# Patient Record
Sex: Male | Born: 1965
Health system: Southern US, Community
[De-identification: ages and names within clinical notes are randomized; demographics above are authoritative.]

## PROBLEM LIST (undated history)

## (undated) ENCOUNTER — Emergency Department (HOSPITAL_COMMUNITY): Admission: EM | Payer: Self-pay

## (undated) DIAGNOSIS — R030 Elevated blood-pressure reading, without diagnosis of hypertension: Secondary | ICD-10-CM

## (undated) DIAGNOSIS — Z8739 Personal history of other diseases of the musculoskeletal system and connective tissue: Secondary | ICD-10-CM

## (undated) DIAGNOSIS — M109 Gout, unspecified: Secondary | ICD-10-CM

## (undated) DIAGNOSIS — I10 Essential (primary) hypertension: Secondary | ICD-10-CM

## (undated) DIAGNOSIS — M1990 Unspecified osteoarthritis, unspecified site: Secondary | ICD-10-CM

## (undated) DIAGNOSIS — E785 Hyperlipidemia, unspecified: Secondary | ICD-10-CM

## (undated) DIAGNOSIS — L03116 Cellulitis of left lower limb: Secondary | ICD-10-CM

## (undated) HISTORY — DX: Essential (primary) hypertension: I10

## (undated) HISTORY — DX: Gout, unspecified: M10.9

## (undated) HISTORY — PX: KNEE ARTHROSCOPY: SHX127

## (undated) HISTORY — DX: Hyperlipidemia, unspecified: E78.5

## (undated) HISTORY — PX: REPLACEMENT TOTAL KNEE: SUR1224

---
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
## 2008-04-12 ENCOUNTER — Encounter: Admission: RE | Admit: 2008-04-12 | Discharge: 2008-04-12 | Payer: Self-pay | Admitting: Orthopedic Surgery

## 2009-12-06 ENCOUNTER — Encounter: Admission: RE | Admit: 2009-12-06 | Discharge: 2009-12-06 | Payer: Self-pay | Admitting: Neurosurgery

## 2015-11-16 ENCOUNTER — Other Ambulatory Visit: Payer: Self-pay | Admitting: Neurosurgery

## 2015-11-24 MED ORDER — CEFAZOLIN SODIUM-DEXTROSE 2-4 GM/100ML-% IV SOLN
2.0000 g | INTRAVENOUS | Status: AC
  Administered 2015-11-25: 2 g via INTRAVENOUS
  Filled 2015-11-24: qty 100

## 2015-11-25 ENCOUNTER — Encounter (HOSPITAL_COMMUNITY): Payer: Self-pay | Admitting: *Deleted

## 2015-11-25 ENCOUNTER — Ambulatory Visit (HOSPITAL_COMMUNITY)
Admission: RE | Admit: 2015-11-25 | Discharge: 2015-11-25 | Disposition: A | Discharge status: Home / Self Care | Payer: Self-pay | Source: Ambulatory Visit | Attending: Neurosurgery | Admitting: Neurosurgery

## 2015-11-25 ENCOUNTER — Ambulatory Visit (HOSPITAL_COMMUNITY): Payer: Self-pay | Admitting: Anesthesiology

## 2015-11-25 ENCOUNTER — Ambulatory Visit: Admit: 2015-11-25 | Payer: Self-pay | Admitting: Neurosurgery

## 2015-11-25 ENCOUNTER — Encounter (HOSPITAL_COMMUNITY)
Admission: RE | Disposition: A | Discharge status: Home / Self Care | Payer: Self-pay | Source: Ambulatory Visit | Attending: Neurosurgery

## 2015-11-25 DIAGNOSIS — Z87891 Personal history of nicotine dependence: Secondary | ICD-10-CM | POA: Insufficient documentation

## 2015-11-25 DIAGNOSIS — G5603 Carpal tunnel syndrome, bilateral upper limbs: Secondary | ICD-10-CM | POA: Insufficient documentation

## 2015-11-25 DIAGNOSIS — Z6838 Body mass index (BMI) 38.0-38.9, adult: Secondary | ICD-10-CM | POA: Insufficient documentation

## 2015-11-25 HISTORY — PX: CARPAL TUNNEL RELEASE: SHX101

## 2015-11-25 SURGERY — CARPAL TUNNEL RELEASE
Anesthesia: Monitor Anesthesia Care | Laterality: Right

## 2015-11-25 SURGERY — CARPAL TUNNEL RELEASE
Anesthesia: Moderate Sedation | Laterality: Right

## 2015-11-25 MED ORDER — FENTANYL CITRATE (PF) 250 MCG/5ML IJ SOLN
INTRAMUSCULAR | Status: AC
Start: 2015-11-25 — End: 2015-11-25
  Filled 2015-11-25: qty 5

## 2015-11-25 MED ORDER — PROPOFOL 10 MG/ML IV BOLUS
INTRAVENOUS | Status: DC | PRN
  Administered 2015-11-25: 180 mg via INTRAVENOUS

## 2015-11-25 MED ORDER — PROPOFOL 10 MG/ML IV BOLUS
INTRAVENOUS | Status: AC
  Filled 2015-11-25: qty 20

## 2015-11-25 MED ORDER — LIDOCAINE HCL (CARDIAC) 20 MG/ML IV SOLN
INTRAVENOUS | Status: AC
  Filled 2015-11-25: qty 5

## 2015-11-25 MED ORDER — OXYCODONE HCL 5 MG/5ML PO SOLN: 5.0000 mg | Freq: Once | ORAL | Status: DC | PRN

## 2015-11-25 MED ORDER — OXYCODONE HCL 5 MG PO TABS: 5.0000 mg | ORAL_TABLET | Freq: Once | ORAL | Status: DC | PRN

## 2015-11-25 MED ORDER — HYDROCODONE-ACETAMINOPHEN 5-325 MG PO TABS: 1.0000 | ORAL_TABLET | ORAL | Status: DC | PRN

## 2015-11-25 MED ORDER — PHENYLEPHRINE HCL 10 MG/ML IJ SOLN
INTRAMUSCULAR | Status: DC | PRN
Start: 2015-11-25 — End: 2015-11-25
  Administered 2015-11-25: 40 ug via INTRAVENOUS

## 2015-11-25 MED ORDER — ONDANSETRON HCL 4 MG/2ML IJ SOLN
INTRAMUSCULAR | Status: AC
  Filled 2015-11-25: qty 2

## 2015-11-25 MED ORDER — ONDANSETRON HCL 4 MG/2ML IJ SOLN
INTRAMUSCULAR | Status: DC | PRN
  Administered 2015-11-25: 4 mg via INTRAVENOUS

## 2015-11-25 MED ORDER — EPHEDRINE SULFATE 50 MG/ML IJ SOLN
INTRAMUSCULAR | Status: DC | PRN
  Administered 2015-11-25: 10 mg via INTRAVENOUS
  Administered 2015-11-25: 5 mg via INTRAVENOUS
  Administered 2015-11-25: 10 mg via INTRAVENOUS

## 2015-11-25 MED ORDER — LIDOCAINE-EPINEPHRINE 1 %-1:100000 IJ SOLN
INTRAMUSCULAR | Status: DC | PRN
  Administered 2015-11-25: 9 mL

## 2015-11-25 MED ORDER — DEXAMETHASONE SODIUM PHOSPHATE 4 MG/ML IJ SOLN
INTRAMUSCULAR | Status: DC | PRN
  Administered 2015-11-25: 10 mg via INTRAVENOUS

## 2015-11-25 MED ORDER — FENTANYL CITRATE (PF) 100 MCG/2ML IJ SOLN: 25.0000 ug | INTRAMUSCULAR | Status: DC | PRN

## 2015-11-25 MED ORDER — ACETAMINOPHEN 10 MG/ML IV SOLN
INTRAVENOUS | Status: AC
  Administered 2015-11-25: 1000 mg via INTRAVENOUS
  Filled 2015-11-25: qty 100

## 2015-11-25 MED ORDER — LACTATED RINGERS IV SOLN
INTRAVENOUS | Status: DC | PRN
  Administered 2015-11-25: 07:00:00 via INTRAVENOUS

## 2015-11-25 MED ORDER — LIDOCAINE HCL (CARDIAC) 10 MG/ML IV SOLN
INTRAVENOUS | Status: DC | PRN
  Administered 2015-11-25: 80 mg via INTRAVENOUS

## 2015-11-25 MED ORDER — ACETAMINOPHEN 160 MG/5ML PO SOLN: 325.0000 mg | ORAL | Status: DC | PRN

## 2015-11-25 MED ORDER — ACETAMINOPHEN 325 MG PO TABS: 325.0000 mg | ORAL_TABLET | ORAL | Status: DC | PRN

## 2015-11-25 MED ORDER — MIDAZOLAM HCL 2 MG/2ML IJ SOLN
INTRAMUSCULAR | Status: AC
  Filled 2015-11-25: qty 2

## 2015-11-25 MED ORDER — MIDAZOLAM HCL 5 MG/5ML IJ SOLN
INTRAMUSCULAR | Status: DC | PRN
  Administered 2015-11-25: 2 mg via INTRAVENOUS

## 2015-11-25 MED ORDER — BUPIVACAINE HCL (PF) 0.5 % IJ SOLN
INTRAMUSCULAR | Status: DC | PRN
  Administered 2015-11-25: 9 mL

## 2015-11-25 MED ORDER — FENTANYL CITRATE (PF) 100 MCG/2ML IJ SOLN
INTRAMUSCULAR | Status: DC | PRN
  Administered 2015-11-25: 50 ug via INTRAVENOUS
  Administered 2015-11-25 (×2): 100 ug via INTRAVENOUS

## 2015-11-25 SURGICAL SUPPLY — 32 items
BANDAGE GAUZE 4  KLING STR (GAUZE/BANDAGES/DRESSINGS) ×2 IMPLANT
BLADE SURG 15 STRL LF DISP TIS (BLADE) ×2 IMPLANT
BLADE SURG 15 STRL SS (BLADE) ×4
BRUSH SCRUB EZ PLAIN DRY (MISCELLANEOUS) ×4 IMPLANT
CORDS BIPOLAR (ELECTRODE) ×2 IMPLANT
DRAPE EXTREMITY T 121X128X90 (DRAPE) ×2 IMPLANT
DRAPE PROXIMA HALF (DRAPES) IMPLANT
DRSG EMULSION OIL 3X3 NADH (GAUZE/BANDAGES/DRESSINGS) ×2 IMPLANT
GAUZE SPONGE 4X4 12PLY STRL (GAUZE/BANDAGES/DRESSINGS) ×2 IMPLANT
GAUZE SPONGE 4X4 16PLY XRAY LF (GAUZE/BANDAGES/DRESSINGS) ×2 IMPLANT
GLOVE BIOGEL PI IND STRL 8 (GLOVE) ×1 IMPLANT
GLOVE BIOGEL PI INDICATOR 8 (GLOVE) ×1
GLOVE ECLIPSE 7.5 STRL STRAW (GLOVE) ×2 IMPLANT
GLOVE EXAM NITRILE LRG STRL (GLOVE) IMPLANT
GLOVE EXAM NITRILE MD LF STRL (GLOVE) IMPLANT
GLOVE EXAM NITRILE XL STR (GLOVE) IMPLANT
GLOVE EXAM NITRILE XS STR PU (GLOVE) IMPLANT
GLOVE INDICATOR 7.0 STRL GRN (GLOVE) ×2 IMPLANT
GLOVE INDICATOR 7.5 STRL GRN (GLOVE) ×4 IMPLANT
GLOVE SS N UNI LF 6.5 STRL (GLOVE) ×4 IMPLANT
GOWN STRL REUS W/ TWL XL LVL3 (GOWN DISPOSABLE) ×2 IMPLANT
GOWN STRL REUS W/TWL 2XL LVL3 (GOWN DISPOSABLE) IMPLANT
GOWN STRL REUS W/TWL XL LVL3 (GOWN DISPOSABLE) ×4
KIT BASIN OR (CUSTOM PROCEDURE TRAY) ×2 IMPLANT
KIT ROOM TURNOVER OR (KITS) ×2 IMPLANT
PACK SURGICAL SETUP 50X90 (CUSTOM PROCEDURE TRAY) ×2 IMPLANT
PAD ARMBOARD 7.5X6 YLW CONV (MISCELLANEOUS) ×6 IMPLANT
STOCKINETTE 4X48 STRL (DRAPES) ×2 IMPLANT
SUT ETHILON 4 0 PS 2 18 (SUTURE) ×2 IMPLANT
SUT VIC AB 3-0 SH 8-18 (SUTURE) ×2 IMPLANT
TOWEL OR 17X24 6PK STRL BLUE (TOWEL DISPOSABLE) ×6 IMPLANT
UNDERPAD 30X30 INCONTINENT (UNDERPADS AND DIAPERS) ×2 IMPLANT

## 2015-11-25 NOTE — Transfer of Care (Signed)
Immediate Anesthesia Transfer of Care Note  Patient: Christopher Fitzpatrick E Oldenkamp  Procedure(s) Performed: Procedure(s): CARPAL TUNNEL RELEASE (Right)  Patient Location: PACU  Anesthesia Type:General  Level of Consciousness: awake, alert , oriented and patient cooperative  Airway & Oxygen Therapy: Patient Spontanous Breathing  Post-op Assessment: Report given to RN, Post -op Vital signs reviewed and stable and Patient moving all extremities  Post vital signs: Reviewed and stable  Last Vitals:  Filed Vitals:   11/25/15 0915 11/25/15 0930  BP: 133/75 129/74  Pulse: 77 77  Temp: 36.3 C   Resp: 8 13    Complications: No apparent anesthesia complications

## 2015-11-25 NOTE — Progress Notes (Signed)
Christopher Fitzpatrick to be D/C'd home per MD order. Discussed with the patient and all questions fully answered.  VSS, Surgical site clean, dry, intact with dressing in place.  IV catheter discontinued intact. Site without signs and symptoms of complications. Dressing and pressure applied.  An After Visit Summary was printed and given to the patient. Patient received prescription.  D/c education completed with patient/family including follow up instructions, medication list, d/c activities limitations if indicated, with other d/c instructions as indicated by MD - patient able to verbalize understanding, all questions fully answered.   Patient instructed to return to ED, call 911, or call MD for any changes in condition.   Patient to be escorted via WC, and D/C home via private auto.

## 2015-11-25 NOTE — Anesthesia Postprocedure Evaluation (Signed)
Anesthesia Post Note  Patient: Christopher Fitzpatrick  Procedure(s) Performed: Procedure(s) (LRB): CARPAL TUNNEL RELEASE (Right)  Patient location during evaluation: PACU Anesthesia Type: General Level of consciousness: awake and alert Pain management: pain level controlled Vital Signs Assessment: post-procedure vital signs reviewed and stable Respiratory status: spontaneous breathing, nonlabored ventilation and respiratory function stable Cardiovascular status: blood pressure returned to baseline and stable Postop Assessment: no signs of nausea or vomiting Anesthetic complications: no    Last Vitals:  Filed Vitals:   11/25/15 1000 11/25/15 1056  BP: 121/78 128/88  Pulse: 76 64  Temp:  36.3 C  Resp: 12 13    Last Pain: There were no vitals filed for this visit.               Shailene Demonbreun A

## 2015-11-25 NOTE — Anesthesia Procedure Notes (Signed)
Procedure Name: LMA Insertion Date/Time: 11/25/2015 7:58 AM Performed by: Orlinda BlalockMCMILLEN, Myia Bergh L Pre-anesthesia Checklist: Patient identified, Emergency Drugs available, Suction available and Patient being monitored Patient Re-evaluated:Patient Re-evaluated prior to inductionOxygen Delivery Method: Circle System Utilized Preoxygenation: Pre-oxygenation with 100% oxygen Intubation Type: IV induction Ventilation: Mask ventilation without difficulty LMA: LMA inserted LMA Size: 5.0 Number of attempts: 1 Placement Confirmation: positive ETCO2 and breath sounds checked- equal and bilateral Tube secured with: Tape Dental Injury: Teeth and Oropharynx as per pre-operative assessment

## 2015-11-25 NOTE — Progress Notes (Signed)
Filed Vitals:   11/25/15 0651 11/25/15 0652 11/25/15 0915  BP:  137/90 133/75  Pulse: 58  77  Temp: 97.9 F (36.6 C)  97.3 F (36.3 C)  TempSrc: Oral    Resp: 20  8  Height: 5\' 10"  (1.778 m)    Weight: 120.203 kg (265 lb)    SpO2: 97%  95%    Patient resting in the PACU, steadily becoming more alert. Following commands. Moving digits of right hand well. Good capillary refill in right hand digits.  Plan: To be discharged home once fully awake alert, and cleared by anesthesia service for discharge. Scheduled follow-up with me in the office in 4 days.  Hewitt ShortsNUDELMAN,ROBERT W, MD 11/25/2015, 9:25 AM

## 2015-11-25 NOTE — Anesthesia Preprocedure Evaluation (Signed)
Anesthesia Evaluation  Patient identified by MRN, date of birth, ID band Patient awake    Reviewed: Allergy & Precautions, NPO status , Patient's Chart, lab work & pertinent test results  History of Anesthesia Complications Negative for: history of anesthetic complications  Airway Mallampati: III  TM Distance: >3 FB Neck ROM: Full    Dental  (+) Teeth Intact   Pulmonary neg pulmonary ROS,    breath sounds clear to auscultation       Cardiovascular negative cardio ROS   Rhythm:Regular     Neuro/Psych negative neurological ROS  negative psych ROS   GI/Hepatic Neg liver ROS,   Endo/Other  Morbid obesity  Renal/GU negative Renal ROS     Musculoskeletal   Abdominal   Peds  Hematology negative hematology ROS (+)   Anesthesia Other Findings   Reproductive/Obstetrics                             Anesthesia Physical Anesthesia Plan  ASA: II  Anesthesia Plan: MAC   Post-op Pain Management:    Induction: Intravenous  Airway Management Planned: Nasal Cannula, Natural Airway and Simple Face Mask  Additional Equipment: None  Intra-op Plan:   Post-operative Plan:   Informed Consent: I have reviewed the patients History and Physical, chart, labs and discussed the procedure including the risks, benefits and alternatives for the proposed anesthesia with the patient or authorized representative who has indicated his/her understanding and acceptance.   Dental advisory given  Plan Discussed with: CRNA and Surgeon  Anesthesia Plan Comments:         Anesthesia Quick Evaluation

## 2015-11-25 NOTE — Discharge Instructions (Signed)
-  Elevate arms and hands on pillows while sitting or in bed. Wear sling when up moving around. -Use prescribed Norco or Tylenol for pain control. -Follow Up for bandage removal Tuesday 4/11 with Dr. Jule SerNudleman. -Perform hand exercises hourly.

## 2015-11-25 NOTE — Progress Notes (Signed)
Orthopedic Tech Progress Note Patient Details:  Christopher HallLee E Delapena 06-Jun-1966 [id number]  Ortho Devices Type of Ortho Device: Arm sling Ortho Device/Splint Location: rue Ortho Device/Splint Interventions: Application Viewed order from doctor's order list  Nikki DomCrawford, Yaneisy Wenz 11/25/2015, 9:26 AM

## 2015-11-25 NOTE — H&P (Signed)
Subjective: Patient is a 50 y.o. male who is admitted for treatment of bilateral carpal tunnel syndrome. Patient has been having difficulties with numbness and tingling in his hands bilaterally, worse on the right than the left, for the past 9 months. Using a mouse, he finds that he has to tend to shake his right hand to relieve the numbness and tingling. More recently he is found that the numbness and tingling can awaken him at night. About a month ago he was doing some landscaping, including some shoveling, and since then the numbness and tingling has become persistent to the first through fourth digits of the hands bilaterally. He reports good sensation in the fifth digits. He is found to that he developed a tendency to drop things from his hands, he feels most likely due to not feeling the objects.  We evaluated him with bilateral upper extremity nerve conductions which revealed severe carpal tunnel syndrome with motor demyelination and sensory axonal loss. Examination showed weakness of the right opponens pollicis. Patient is brought to surgery for a right carpal tunnel release, with plans to proceed with left carpal tunnel release once he is recovered from the initial surgery.  Past medical history: Patient has no history of hypertension, myocardial infarction, cancer, stroke, diabetes, peptic ulcer disease, or lung disease. Previous surgery includes multiple knee surgeries as well as bilateral herniorrhaphies. He denies allergies to medications. And currently takes no medications.  Family history:  Parents have passed on. Mother died at 171, she had cancer, hypertension, diabetes, and kidney disease. Father died at age 50, he had hypertension and diabetes.  social history: Patient is married. He is self-employed. He quit smoking 3 months ago. he smoked for 27 years before that. He drinks alcohol beverages socially. He denies history of substance abuse.  Review of Systems A comprehensive review of  systems was negative.   EXAM: Patient well-developed well-nourished white male in no acute distress.  Lungs are clear to auscultation , the patient has symmetrical respiratory excursion. Heart has a regular rate and rhythm normal S1 and S2 no murmur.   Abdomen is soft nontender nondistended bowel sounds are present. Extremity examination shows no clubbing cyanosis or edema. Motor examination shows 5 over 5 strength in the upper extremities except for the right opponens pollicis which is 4+/5. We find the deltoid biceps triceps  and grip are 5/5, as are the left intrinsics. Sensation decreased to pinprick in the first through fourth digits of the hands bilaterally with intact sensation in the fifth digits bilaterally.  Reflexes are absent and symmetrical, and without evidence of pathologic reflexes. Patient has a normal gait and stance.   Assessment/Plan: Patient with severe bilateral carpal tunnel syndrome, who is admitted for right carpal tunnel release, with a plan to proceed with left carpal tunnel release once he is recovered sufficiently from the initial surgery. I discussed the nature of his condition, the nature the surgical procedure, the uncertainty of clinical recovery, typical length of surgery and recovery, and risks of surgery including risks of infection, bleeding, neurologic dysfunction, and anesthetic complications of myocardial infarction, stroke, pneumonia, and death.   Hewitt ShortsNUDELMAN,ROBERT W, MD 11/25/2015 6:51 AM

## 2015-11-25 NOTE — Op Note (Signed)
11/25/2015  9:07 AM  PATIENT:  Christopher Fitzpatrick  50 y.o. male  PRE-OPERATIVE DIAGNOSIS:  Bilateral Carpal Tunnel Syndrome  POST-OPERATIVE DIAGNOSIS:  Bilateral Carpal Tunnel Syndrome  PROCEDURE:  Procedure(s):  Right CARPAL TUNNEL RELEASE  SURGEON:  Surgeon(s): Shirlean Kellyobert Nudelman, MD  ANESTHESIA:   general with LMA  EBL:  Total I/O In: -  Out: 0.5 [Blood:0.5]  BLOOD ADMINISTERED:none  COUNT:  Correct per nursing staff  DICTATION: Patient was brought to the operating room and placed under general anesthesia. The right upper extremity was prepped with Betadine soap and solution and draped in a sterile fashion. An incision was made in the right palm extending from just distal to the distal carpal crease towards the mid palm. It is located just medial to the thenar crease. Dissection was carried down through the subcutaneous tissue to the transverse carpal ligament. The ligament was opened sharply from a distal to proximal direction taking care to leave the underlying structures undisturbed. It was opened in its full distal to proximal extent. Good decompression of the carpal tunnel and its contents was achieved. We then proceeded with closure. Subcutaneous layer was closed with interrupted inverted 3-0 undyed Vicryl suture. Skin is approximate interrupted horizontal mattress sutures using 4-0 nylon. The wound was dressed with adaptic, gauze fluffs, and wrapped with a Kling. The procedure was tolerated well, and following the procedure the patient was transferred to the recovery room for further care.  PLAN OF CARE: Discharge to home after PACU  PATIENT DISPOSITION:  PACU - hemodynamically stable.

## 2015-11-25 NOTE — Progress Notes (Signed)
No labs needed per Dr. Moser 

## 2015-11-29 ENCOUNTER — Encounter (HOSPITAL_COMMUNITY): Payer: Self-pay | Admitting: Neurosurgery

## 2015-12-07 ENCOUNTER — Other Ambulatory Visit: Payer: Self-pay | Admitting: Neurosurgery

## 2015-12-21 ENCOUNTER — Encounter (HOSPITAL_COMMUNITY): Payer: Self-pay | Admitting: *Deleted

## 2015-12-21 NOTE — Progress Notes (Signed)
Pt states nothing has changed with his medications, allergies, medical and surgical history since he was seen here on 11/25/15 for surgery. Denies any recent chest pain or sob.

## 2015-12-22 MED ORDER — DEXTROSE 5 % IV SOLN: 3.0000 g | INTRAVENOUS | Status: DC

## 2015-12-22 MED ORDER — DEXTROSE 5 % IV SOLN
3000.0000 mg | INTRAVENOUS | Status: AC
  Administered 2015-12-23: 3 g via INTRAVENOUS
  Filled 2015-12-22: qty 3000

## 2015-12-23 ENCOUNTER — Encounter (HOSPITAL_COMMUNITY): Payer: Self-pay | Admitting: *Deleted

## 2015-12-23 ENCOUNTER — Ambulatory Visit (HOSPITAL_COMMUNITY): Payer: MEDICAID | Admitting: Certified Registered Nurse Anesthetist

## 2015-12-23 ENCOUNTER — Encounter (HOSPITAL_COMMUNITY)
Admission: RE | Disposition: A | Discharge status: Home / Self Care | Payer: Self-pay | Source: Ambulatory Visit | Attending: Neurosurgery

## 2015-12-23 ENCOUNTER — Ambulatory Visit (HOSPITAL_COMMUNITY)
Admission: RE | Admit: 2015-12-23 | Discharge: 2015-12-23 | Disposition: A | Discharge status: Home / Self Care | Payer: Self-pay | Source: Ambulatory Visit | Attending: Neurosurgery | Admitting: Neurosurgery

## 2015-12-23 DIAGNOSIS — Z87891 Personal history of nicotine dependence: Secondary | ICD-10-CM | POA: Insufficient documentation

## 2015-12-23 DIAGNOSIS — G5603 Carpal tunnel syndrome, bilateral upper limbs: Secondary | ICD-10-CM | POA: Insufficient documentation

## 2015-12-23 HISTORY — PX: CARPAL TUNNEL RELEASE: SHX101

## 2015-12-23 SURGERY — CARPAL TUNNEL RELEASE
Anesthesia: General | Site: Arm Lower | Laterality: Left

## 2015-12-23 MED ORDER — ACETAMINOPHEN 10 MG/ML IV SOLN
INTRAVENOUS | Status: DC | PRN
  Administered 2015-12-23: 1000 mg via INTRAVENOUS

## 2015-12-23 MED ORDER — LACTATED RINGERS IV SOLN
INTRAVENOUS | Status: DC | PRN
  Administered 2015-12-23: 07:00:00 via INTRAVENOUS

## 2015-12-23 MED ORDER — FENTANYL CITRATE (PF) 100 MCG/2ML IJ SOLN
INTRAMUSCULAR | Status: DC | PRN
  Administered 2015-12-23: 100 ug via INTRAVENOUS
  Administered 2015-12-23: 50 ug via INTRAVENOUS
  Administered 2015-12-23 (×2): 25 ug via INTRAVENOUS

## 2015-12-23 MED ORDER — PROPOFOL 10 MG/ML IV BOLUS
INTRAVENOUS | Status: AC
  Filled 2015-12-23: qty 40

## 2015-12-23 MED ORDER — FENTANYL CITRATE (PF) 250 MCG/5ML IJ SOLN
INTRAMUSCULAR | Status: AC
  Filled 2015-12-23: qty 5

## 2015-12-23 MED ORDER — LIDOCAINE HCL (CARDIAC) 20 MG/ML IV SOLN
INTRAVENOUS | Status: DC | PRN
  Administered 2015-12-23: 50 mg via INTRAVENOUS

## 2015-12-23 MED ORDER — PROPOFOL 10 MG/ML IV BOLUS
INTRAVENOUS | Status: DC | PRN
  Administered 2015-12-23: 200 mg via INTRAVENOUS

## 2015-12-23 MED ORDER — BUPIVACAINE HCL 0.5 % IJ SOLN
INTRAMUSCULAR | Status: DC | PRN
  Administered 2015-12-23: 8 mL

## 2015-12-23 MED ORDER — MIDAZOLAM HCL 5 MG/5ML IJ SOLN
INTRAMUSCULAR | Status: DC | PRN
  Administered 2015-12-23 (×2): 1 mg via INTRAVENOUS

## 2015-12-23 MED ORDER — EPHEDRINE SULFATE 50 MG/ML IJ SOLN
INTRAMUSCULAR | Status: DC | PRN
  Administered 2015-12-23: 10 mg via INTRAVENOUS
  Administered 2015-12-23: 5 mg via INTRAVENOUS

## 2015-12-23 MED ORDER — ONDANSETRON HCL 4 MG/2ML IJ SOLN
INTRAMUSCULAR | Status: DC | PRN
  Administered 2015-12-23: 4 mg via INTRAVENOUS

## 2015-12-23 MED ORDER — MIDAZOLAM HCL 2 MG/2ML IJ SOLN
INTRAMUSCULAR | Status: AC
  Filled 2015-12-23: qty 2

## 2015-12-23 SURGICAL SUPPLY — 29 items
BANDAGE GAUZE 4  KLING STR (GAUZE/BANDAGES/DRESSINGS) ×2 IMPLANT
BLADE SURG 15 STRL LF DISP TIS (BLADE) ×2 IMPLANT
BLADE SURG 15 STRL SS (BLADE) ×4
BRUSH SCRUB EZ PLAIN DRY (MISCELLANEOUS) ×2 IMPLANT
CORDS BIPOLAR (ELECTRODE) ×2 IMPLANT
DRAPE EXTREMITY T 121X128X90 (DRAPE) ×2 IMPLANT
DRAPE PROXIMA HALF (DRAPES) IMPLANT
DRSG EMULSION OIL 3X3 NADH (GAUZE/BANDAGES/DRESSINGS) ×2 IMPLANT
GAUZE SPONGE 4X4 12PLY STRL (GAUZE/BANDAGES/DRESSINGS) ×2 IMPLANT
GAUZE SPONGE 4X4 16PLY XRAY LF (GAUZE/BANDAGES/DRESSINGS) ×2 IMPLANT
GLOVE BIOGEL PI IND STRL 8 (GLOVE) ×1 IMPLANT
GLOVE BIOGEL PI INDICATOR 8 (GLOVE) ×1
GLOVE ECLIPSE 7.5 STRL STRAW (GLOVE) ×2 IMPLANT
GLOVE EXAM NITRILE LRG STRL (GLOVE) IMPLANT
GLOVE EXAM NITRILE MD LF STRL (GLOVE) IMPLANT
GLOVE EXAM NITRILE XL STR (GLOVE) IMPLANT
GLOVE EXAM NITRILE XS STR PU (GLOVE) IMPLANT
GOWN STRL REUS W/ TWL XL LVL3 (GOWN DISPOSABLE) ×2 IMPLANT
GOWN STRL REUS W/TWL 2XL LVL3 (GOWN DISPOSABLE) IMPLANT
GOWN STRL REUS W/TWL XL LVL3 (GOWN DISPOSABLE) ×4
KIT BASIN OR (CUSTOM PROCEDURE TRAY) ×2 IMPLANT
KIT ROOM TURNOVER OR (KITS) ×2 IMPLANT
PACK SURGICAL SETUP 50X90 (CUSTOM PROCEDURE TRAY) ×2 IMPLANT
PAD ARMBOARD 7.5X6 YLW CONV (MISCELLANEOUS) ×6 IMPLANT
STOCKINETTE 4X48 STRL (DRAPES) ×2 IMPLANT
SUT ETHILON 4 0 PS 2 18 (SUTURE) ×2 IMPLANT
SUT VIC AB 3-0 SH 8-18 (SUTURE) ×2 IMPLANT
TOWEL OR 17X24 6PK STRL BLUE (TOWEL DISPOSABLE) ×6 IMPLANT
UNDERPAD 30X30 INCONTINENT (UNDERPADS AND DIAPERS) ×2 IMPLANT

## 2015-12-23 NOTE — Op Note (Signed)
12/23/2015  8:21 AM  PATIENT:  Christopher Fitzpatrick  50 y.o. male  PRE-OPERATIVE DIAGNOSIS:  Bilateral carpal tunnel syndrome  POST-OPERATIVE DIAGNOSIS:  Bilateral carpal tunnel syndrome  PROCEDURE:  Procedure(s):  Left CARPAL TUNNEL RELEASE  SURGEON:  Surgeon(s): Shirlean Kellyobert Nudelman, MD  ANESTHESIA:   general  EBL:  Total I/O In: -  Out: 10 [Blood:10]  BLOOD ADMINISTERED:none  COUNT: Correct per nursing staff  DICTATION: Patient was brought to the operating room and placed under general anesthesia, using an LMA by the anesthesia service. The left upper extremity was prepped with Betadine soap and solution and draped in a sterile fashion. An incision was made in the left palm extending from just distal to the distal carpal crease towards the mid palm. It is located just medial to the thenar crease. Dissection was carried down to subcutaneous tissue to the transverse carpal ligament. The ligament was opened sharply from a distal to proximal direction taking care to leave the underlying structures undisturbed. It was opened in its full distal to proximal extent. Good decompression of the carpal tunnel and its contents was achieved. Bipolar cautery was used to maintain hemostasis. Once the decompression was completed and hemostasis established, we injected 9 mL of O.5 percent Marcaine into the subcutaneous tissues. We then proceeded with closure. Subcutaneous layer was closed with interrupted inverted 3-0 undyed Vicryl suture. Skin is approximate interrupted horizontal mattress sutures using 4-0 nylon. The wound was dressed with gauze fluffs and wrapped with a Kling. The procedure was tolerated well, and following the procedure the patient wishes to the recovery room for further care.   PLAN OF CARE: Discharge to home after PACU  PATIENT DISPOSITION:  PACU - hemodynamically stable.   Delay start of Pharmacological VTE agent (>24hrs) due to surgical blood loss or risk of bleeding:  yes

## 2015-12-23 NOTE — Anesthesia Procedure Notes (Signed)
Procedure Name: LMA Insertion Date/Time: 12/23/2015 7:33 AM Performed by: Bobbie StackANDERSON, Naiomi Musto KIRSTEN Pre-anesthesia Checklist: Patient identified, Timeout performed, Emergency Drugs available, Suction available and Patient being monitored Patient Re-evaluated:Patient Re-evaluated prior to inductionOxygen Delivery Method: Circle system utilized Preoxygenation: Pre-oxygenation with 100% oxygen Intubation Type: IV induction Ventilation: Two handed mask ventilation required, Oral airway inserted - appropriate to patient size and Mask ventilation without difficulty LMA: LMA inserted LMA Size: 5.0 Number of attempts: 1 Tube secured with: Tape Dental Injury: Teeth and Oropharynx as per pre-operative assessment

## 2015-12-23 NOTE — H&P (Signed)
Subjective: Patient is a 50 y.o. left-handed white male who is admitted for treatment of bilateral carpal tunnel syndrome.  Patient presented with bilateral numbness and tingling in his hands, and weakness of the right opponens pollicis, and was found electrophysiologically to have bilateral carpal tunnel syndrome right worse than left. He is 4 weeks status post right carpal tunnel release, which is healed well, and returns now for left carpal tunnel release.   Past Medical History  Diagnosis Date  . Medical history non-contributory     Past Surgical History  Procedure Laterality Date  . Knee reconstruction Right 1985  . Hernia repair  1986  . Knee arthroplasty Right 1987  . Knee arthroscopy Right [id number] left knee  . Carpal tunnel release Right 11/25/2015    Procedure: CARPAL TUNNEL RELEASE;  Surgeon: Shirlean Kellyobert Nudelman, MD;  Location: [facility];  Service: Neurosurgery;  Laterality: Right;    Prescriptions prior to admission  Medication Sig Dispense Refill Last Dose  . HYDROcodone-acetaminophen (NORCO) 5-325 MG tablet Take 1-2 tablets by mouth every 4 (four) hours as needed (pain). 50 tablet 0 Past Month at Unknown time   No Known Allergies  Social History  Substance Use Topics  . Smoking status: Former Smoker -- 1.50 packs/day for 25 years    Types: Cigarettes, Cigars    Quit date: 07/27/2015  . Smokeless tobacco: Not on file  . Alcohol Use: No    History reviewed. No pertinent family history.   Review of Systems A comprehensive review of systems was negative.  Objective: Vital signs in last 24 hours: Temp:  [97.7 F (36.5 C)] 97.7 F (36.5 C) (05/05 0614) Pulse Rate:  [62] 62 (05/05 0614) Resp:  [20] 20 (05/05 0614) BP: (143)/(90) 143/90 mmHg (05/05 0614) SpO2:  [96 %] 96 % (05/05 0614) Weight:  [120.203 kg (265 lb)] 120.203 kg (265 lb) (05/05 [id number])  EXAM: Patient well-developed well-nourished white male in no acute distress. Lungs are clear to auscultation , the  patient has symmetrical respiratory excursion. Heart has a regular rate and rhythm normal S1 and S2 no murmur. Abdomen is soft nontender nondistended bowel sounds are present. Extremity examination shows no clubbing cyanosis or edema; the right hand palm incision is nicely healed. Motor examination shows 5 over 5 strength in the upper extremities except for the right opponens pollicis which is 4+/5. We find the deltoid biceps triceps and grip are 5/5, as are the left intrinsics. Sensation decreased to pinprick in the first through fourth digits of the hands bilaterally with intact sensation in the fifth digits bilaterally. Reflexes are absent and symmetrical, and without evidence of pathologic reflexes. Patient has a normal gait and stance.  Assessment/Plan: Patient who presented with severe bilateral carpal tunnel syndrome, who is now status post a right carpal tunnel release 4 weeks ago, and returns now for a left carpal tunnel release.  I discussed the nature of his condition, the nature the surgical procedure, the uncertainty of clinical recovery, typical length of surgery and recovery, and risks of surgery including risks of infection, bleeding, neurologic dysfunction, and anesthetic complications of myocardial infarction, stroke, pneumonia, and death.    Hewitt ShortsNUDELMAN,ROBERT W, MD 12/23/2015 7:12 AM

## 2015-12-23 NOTE — Transfer of Care (Signed)
Immediate Anesthesia Transfer of Care Note  Patient: Christopher Fitzpatrick  Procedure(s) Performed: Procedure(s) with comments: CARPAL TUNNEL RELEASE (Left) - Left Carpal Tunnel Release  Patient Location: PACU  Anesthesia Type:General  Level of Consciousness: awake, alert , oriented and patient cooperative  Airway & Oxygen Therapy: Patient Spontanous Breathing and Patient connected to nasal cannula oxygen  Post-op Assessment: Report given to RN, Post -op Vital signs reviewed and stable and Patient moving all extremities X 4  Post vital signs: Reviewed and stable  Last Vitals:  Filed Vitals:   12/23/15 0830 12/23/15 0832  BP:  139/93  Pulse:  75  Temp: 36.5 C   Resp:  12    Last Pain: There were no vitals filed for this visit.    Patients Stated Pain Goal: 3 (12/23/15 [id number])  Complications: No apparent anesthesia complications

## 2015-12-23 NOTE — Anesthesia Preprocedure Evaluation (Signed)
Anesthesia Evaluation  Patient identified by MRN, date of birth, ID band Patient awake    Reviewed: Allergy & Precautions, NPO status , Patient's Chart, lab work & pertinent test results  Airway Mallampati: II  TM Distance: >3 FB Neck ROM: Full    Dental  (+) Teeth Intact, Dental Advisory Given   Pulmonary former smoker,    breath sounds clear to auscultation       Cardiovascular  Rhythm:Regular Rate:Normal     Neuro/Psych    GI/Hepatic   Endo/Other    Renal/GU      Musculoskeletal   Abdominal   Peds  Hematology   Anesthesia Other Findings   Reproductive/Obstetrics                             Anesthesia Physical Anesthesia Plan  ASA: II  Anesthesia Plan: General   Post-op Pain Management:    Induction: Intravenous  Airway Management Planned: LMA  Additional Equipment:   Intra-op Plan:   Post-operative Plan:   Informed Consent: I have reviewed the patients History and Physical, chart, labs and discussed the procedure including the risks, benefits and alternatives for the proposed anesthesia with the patient or authorized representative who has indicated his/her understanding and acceptance.   Dental advisory given  Plan Discussed with: CRNA and Anesthesiologist  Anesthesia Plan Comments:         Anesthesia Quick Evaluation  

## 2015-12-23 NOTE — Anesthesia Postprocedure Evaluation (Signed)
Anesthesia Post Note  Patient: Doloris HallLee E Cattell  Procedure(s) Performed: Procedure(s) (LRB): CARPAL TUNNEL RELEASE (Left)  Patient location during evaluation: PACU Anesthesia Type: General Level of consciousness: awake, awake and alert and oriented Pain management: pain level controlled Vital Signs Assessment: post-procedure vital signs reviewed and stable Respiratory status: spontaneous breathing, nonlabored ventilation and respiratory function stable Cardiovascular status: blood pressure returned to baseline Anesthetic complications: no    Last Vitals:  Filed Vitals:   12/23/15 0902 12/23/15 0913  BP: 138/83   Pulse: 58   Temp:  36.2 C  Resp: 11     Last Pain: There were no vitals filed for this visit.               Arav Bannister COKER

## 2015-12-26 ENCOUNTER — Encounter (HOSPITAL_COMMUNITY): Payer: Self-pay | Admitting: Neurosurgery

## 2017-12-02 ENCOUNTER — Other Ambulatory Visit: Payer: Self-pay

## 2017-12-02 ENCOUNTER — Inpatient Hospital Stay (HOSPITAL_COMMUNITY)
Admission: EM | Admit: 2017-12-02 | Discharge: 2017-12-04 | DRG: 603 | Disposition: A | Discharge status: Home / Self Care | Payer: BLUE CROSS/BLUE SHIELD | Attending: Internal Medicine | Admitting: Internal Medicine

## 2017-12-02 ENCOUNTER — Emergency Department (HOSPITAL_BASED_OUTPATIENT_CLINIC_OR_DEPARTMENT_OTHER)
Admit: 2017-12-02 | Discharge: 2017-12-02 | Disposition: A | Discharge status: Home / Self Care | Payer: BLUE CROSS/BLUE SHIELD | Attending: Emergency Medicine | Admitting: Emergency Medicine

## 2017-12-02 ENCOUNTER — Encounter (HOSPITAL_COMMUNITY): Payer: Self-pay | Admitting: Emergency Medicine

## 2017-12-02 DIAGNOSIS — L03116 Cellulitis of left lower limb: Principal | ICD-10-CM | POA: Diagnosis present

## 2017-12-02 DIAGNOSIS — R609 Edema, unspecified: Secondary | ICD-10-CM | POA: Diagnosis not present

## 2017-12-02 DIAGNOSIS — F17210 Nicotine dependence, cigarettes, uncomplicated: Secondary | ICD-10-CM | POA: Diagnosis present

## 2017-12-02 DIAGNOSIS — R030 Elevated blood-pressure reading, without diagnosis of hypertension: Secondary | ICD-10-CM | POA: Diagnosis present

## 2017-12-02 HISTORY — DX: Cellulitis of left lower limb: L03.116

## 2017-12-02 HISTORY — DX: Elevated blood-pressure reading, without diagnosis of hypertension: R03.0

## 2017-12-02 HISTORY — DX: Personal history of other diseases of the musculoskeletal system and connective tissue: Z87.39

## 2017-12-02 HISTORY — DX: Unspecified osteoarthritis, unspecified site: M19.90

## 2017-12-02 LAB — CBC WITH DIFFERENTIAL/PLATELET
BASOS ABS: 0.1 10*3/uL (ref 0.0–0.1)
Basophils Relative: 1 %
EOS PCT: 4 %
Eosinophils Absolute: 0.4 10*3/uL (ref 0.0–0.7)
HCT: 43.2 % (ref 39.0–52.0)
Hemoglobin: 14.4 g/dL (ref 13.0–17.0)
LYMPHS ABS: 1.8 10*3/uL (ref 0.7–4.0)
LYMPHS PCT: 18 %
MCH: 29.4 pg (ref 26.0–34.0)
MCHC: 33.3 g/dL (ref 30.0–36.0)
MCV: 88.2 fL (ref 78.0–100.0)
MONO ABS: 0.8 10*3/uL (ref 0.1–1.0)
Monocytes Relative: 8 %
Neutro Abs: 6.8 10*3/uL (ref 1.7–7.7)
Neutrophils Relative %: 69 %
PLATELETS: 208 10*3/uL (ref 150–400)
RBC: 4.9 MIL/uL (ref 4.22–5.81)
RDW: 12.7 % (ref 11.5–15.5)
WBC: 9.8 10*3/uL (ref 4.0–10.5)

## 2017-12-02 LAB — COMPREHENSIVE METABOLIC PANEL
ALBUMIN: 4 g/dL (ref 3.5–5.0)
ALT: 32 U/L (ref 17–63)
AST: 30 U/L (ref 15–41)
Alkaline Phosphatase: 62 U/L (ref 38–126)
Anion gap: 10 (ref 5–15)
BUN: 14 mg/dL (ref 6–20)
CO2: 24 mmol/L (ref 22–32)
CREATININE: 1.01 mg/dL (ref 0.61–1.24)
Calcium: 9.3 mg/dL (ref 8.9–10.3)
Chloride: 103 mmol/L (ref 101–111)
GFR calc Af Amer: >60 mL/min (ref >60)
GFR calc non Af Amer: >60 mL/min (ref >60)
GLUCOSE: 86 mg/dL (ref 65–99)
POTASSIUM: 4.3 mmol/L (ref 3.5–5.1)
Sodium: 137 mmol/L (ref 135–145)
TOTAL PROTEIN: 7.2 g/dL (ref 6.5–8.1)
Total Bilirubin: 0.7 mg/dL (ref 0.3–1.2)

## 2017-12-02 LAB — I-STAT CG4 LACTIC ACID, ED: Lactic Acid, Venous: 1.06 mmol/L (ref 0.5–1.9)

## 2017-12-02 NOTE — ED Provider Notes (Signed)
Patient placed in Quick Look pathway, seen and evaluated   Chief Complaint: Left lower extremity edema and erythema  HPI:   Patient presents with left lower extremity edema, erythema, warmth, tightness that has been progressing since 5 days ago.  Reports that he was crawling under his mother-in-law's house when he sustained a small scratch to his lower leg which was pruritic.  The area started to swell and become erythematous.  He was seen last Friday at urgent care and given a steroid injection, Keflex and antihistamine.  He returns with progressing erythema that is now circumferential, warmth, induration and tightness due to progressing edema.    ROS: Reports night sweats, subjective fevers, denies nausea vomiting  Physical Exam:   Gen: No distress  Neuro: Awake and Alert  Skin: Warm    Focused Exam: Lower extremity edema, erythema, warmth, drainage at the initial site of injury, erythema has started to progress to become nearly circumferential in his distal left lower extremity.  Strong dorsalis pedis pulses.    Initiation of care has begun. The patient has been counseled on the process, plan, and necessity for staying for the completion/evaluation, and the remainder of the medical screening examination    Gregary CromerMitchell, Christopher Forgette B, PA-C 12/02/17 1746    Margarita Grizzleay, Danielle, MD 12/03/17 1431

## 2017-12-02 NOTE — Progress Notes (Signed)
Left lower extremity venous duplex has been completed. Negative for obvious evidence of DVT. Results were given to Mathews RobinsonsJessica Mitchell PA.   12/02/17 6:29 PM Olen CordialGreg Merrilee Ancona RVT

## 2017-12-02 NOTE — ED Triage Notes (Signed)
Pt presents with L leg swelling, pain, redness, warmth that started thurs; pt states he crawled under a crawlspace, did not notice any injury at that time, then later noted some itching; wound started to appear like an ingrown hair, then grew to the size dime and now the size of a soda can; redness and swelling has engulfed entire lower leg; yellow clear drainage noted; pt went to urgent care and given shot of antibiotics and given prescription for keflex which he is currently still taking

## 2017-12-03 ENCOUNTER — Other Ambulatory Visit: Payer: Self-pay

## 2017-12-03 ENCOUNTER — Observation Stay (HOSPITAL_COMMUNITY): Payer: BLUE CROSS/BLUE SHIELD

## 2017-12-03 ENCOUNTER — Encounter (HOSPITAL_COMMUNITY): Payer: Self-pay | Admitting: Internal Medicine

## 2017-12-03 DIAGNOSIS — R030 Elevated blood-pressure reading, without diagnosis of hypertension: Secondary | ICD-10-CM | POA: Diagnosis not present

## 2017-12-03 DIAGNOSIS — L03116 Cellulitis of left lower limb: Secondary | ICD-10-CM | POA: Diagnosis not present

## 2017-12-03 MED ORDER — OXYCODONE-ACETAMINOPHEN 5-325 MG PO TABS: 1.0000 | ORAL_TABLET | ORAL | Status: DC | PRN

## 2017-12-03 MED ORDER — HYDRALAZINE HCL 20 MG/ML IJ SOLN: 5.0000 mg | INTRAMUSCULAR | Status: DC | PRN

## 2017-12-03 MED ORDER — ACETAMINOPHEN 325 MG PO TABS: 650.0000 mg | ORAL_TABLET | Freq: Four times a day (QID) | ORAL | Status: DC | PRN

## 2017-12-03 MED ORDER — MUPIROCIN CALCIUM 2 % EX CREA
TOPICAL_CREAM | Freq: Two times a day (BID) | CUTANEOUS | Status: DC
  Administered 2017-12-03: 1 via TOPICAL
  Administered 2017-12-03 – 2017-12-04 (×2): via TOPICAL
  Filled 2017-12-03: qty 15

## 2017-12-03 MED ORDER — ACETAMINOPHEN 650 MG RE SUPP: 650.0000 mg | Freq: Four times a day (QID) | RECTAL | Status: DC | PRN

## 2017-12-03 MED ORDER — VANCOMYCIN HCL IN DEXTROSE 1-5 GM/200ML-% IV SOLN: 1000.0000 mg | Freq: Once | INTRAVENOUS | Status: DC

## 2017-12-03 MED ORDER — ENOXAPARIN SODIUM 40 MG/0.4ML ~~LOC~~ SOLN: 40.0000 mg | SUBCUTANEOUS | Status: DC

## 2017-12-03 MED ORDER — ENOXAPARIN SODIUM 80 MG/0.8ML ~~LOC~~ SOLN
65.0000 mg | SUBCUTANEOUS | Status: DC
  Filled 2017-12-03: qty 0.8

## 2017-12-03 MED ORDER — SODIUM CHLORIDE 0.9 % IV BOLUS
1000.0000 mL | Freq: Once | INTRAVENOUS | Status: AC
  Administered 2017-12-03: 1000 mL via INTRAVENOUS

## 2017-12-03 MED ORDER — DOCUSATE SODIUM 100 MG PO CAPS
100.0000 mg | ORAL_CAPSULE | Freq: Two times a day (BID) | ORAL | Status: DC
  Filled 2017-12-03: qty 1

## 2017-12-03 MED ORDER — SODIUM CHLORIDE 0.9 % IV SOLN
INTRAVENOUS | Status: DC
  Administered 2017-12-03 – 2017-12-04 (×2): via INTRAVENOUS

## 2017-12-03 MED ORDER — ONDANSETRON HCL 4 MG/2ML IJ SOLN: 4.0000 mg | Freq: Three times a day (TID) | INTRAMUSCULAR | Status: DC | PRN

## 2017-12-03 MED ORDER — ONDANSETRON HCL 4 MG/2ML IJ SOLN: 4.0000 mg | Freq: Four times a day (QID) | INTRAMUSCULAR | Status: DC | PRN

## 2017-12-03 MED ORDER — VANCOMYCIN HCL IN DEXTROSE 1-5 GM/200ML-% IV SOLN
1000.0000 mg | Freq: Three times a day (TID) | INTRAVENOUS | Status: DC
  Administered 2017-12-03 – 2017-12-04 (×4): 1000 mg via INTRAVENOUS
  Filled 2017-12-03 (×5): qty 200

## 2017-12-03 MED ORDER — PIPERACILLIN-TAZOBACTAM 3.375 G IVPB 30 MIN
3.3750 g | Freq: Once | INTRAVENOUS | Status: AC
  Administered 2017-12-03: 3.375 g via INTRAVENOUS
  Filled 2017-12-03: qty 50

## 2017-12-03 MED ORDER — ONDANSETRON HCL 4 MG PO TABS: 4.0000 mg | ORAL_TABLET | Freq: Four times a day (QID) | ORAL | Status: DC | PRN

## 2017-12-03 MED ORDER — ZOLPIDEM TARTRATE 5 MG PO TABS: 5.0000 mg | ORAL_TABLET | Freq: Every evening | ORAL | Status: DC | PRN

## 2017-12-03 MED ORDER — VANCOMYCIN HCL 10 G IV SOLR
2000.0000 mg | Freq: Once | INTRAVENOUS | Status: AC
  Administered 2017-12-03: 2000 mg via INTRAVENOUS
  Filled 2017-12-03: qty 2000

## 2017-12-03 MED ORDER — PIPERACILLIN-TAZOBACTAM 3.375 G IVPB
3.3750 g | Freq: Three times a day (TID) | INTRAVENOUS | Status: DC
  Administered 2017-12-03 – 2017-12-04 (×4): 3.375 g via INTRAVENOUS
  Filled 2017-12-03 (×5): qty 50

## 2017-12-03 NOTE — ED Notes (Signed)
Patient transported to X-ray 

## 2017-12-03 NOTE — ED Notes (Signed)
ED Provider at bedside. 

## 2017-12-03 NOTE — Care Management (Signed)
This is a no charge note  Pending admission per PA, Humes  52-year-old woman without significant past medical history who presents with left lower leg cellulitis for 4 days, failed outpatient treatment with Keflex since Saturday. Lower extremity Doppler negative for DVT. Patient was started with vancomycin and Zosyn EDP. Placed on MedSurg bed for observation.   Lorretta HarpXilin Jassmine Vandruff, MD  [facility] Pager [phone]  If 7PM-7AM, please contact night-coverage [email or web address] Password [email or web address] 12/03/2017, 5:24 AM

## 2017-12-03 NOTE — Progress Notes (Signed)
Received patient from ED. Patient alert and oriented. Lower left leg edematous due to cellulitis. Patients leg marked for swelling. Accompanied by spouse. Will continue to monitor.

## 2017-12-03 NOTE — Progress Notes (Signed)
Pharmacy Antibiotic Note  Christopher Fitzpatrick is a 52 y.o. male admitted on 12/02/2017 with cellulitis.  Pharmacy has been consulted for Vancomycin/Zosyn dosing. Pt here with progressive cellulitis of the lower extremity. WBC WNL. Renal function good.   Plan: Vancomycin 2000 mg IV x 1 load, then give 1000 mg IV q8h Zosyn 3.375G IV q8h to be infused over 4 hours Trend WBC, temp, renal function  F/U infectious work-up Drug levels as indicated   Height: 5\' 10"  (177.8 cm) Weight: 294 lb (133.4 kg) IBW/kg (Calculated) : 73  Temp (24hrs), Avg:98.3 F (36.8 C), Min:97.8 F (36.6 C), Max:98.7 F (37.1 C)  Recent Labs  Lab 12/02/17 1720 12/02/17 1743  WBC 9.8  --   CREATININE 1.01  --   LATICACIDVEN  --  1.06    Estimated Creatinine Clearance: 119 mL/min (by C-G formula based on SCr of 1.01 mg/dL).    No Known Allergies  Abran DukeLedford, Dakhari Zuver 12/03/2017 5:08 AM

## 2017-12-03 NOTE — ED Notes (Signed)
Regular breakfast tray ordered.  

## 2017-12-03 NOTE — ED Notes (Addendum)
Family up to NF desk requesting updates, provided answers to questions and apologized for wait times. Encouraged patient and family to stay to be seen.

## 2017-12-03 NOTE — H&P (Signed)
History and Physical    Christopher Fitzpatrick ZOX:[id number] DOB: 04/19/1966 DOA: 12/02/2017  PCP: Patient, No Pcp Per - last saw a doctor was maybe 12 years ago Consultants:  None Patient coming from:   Home - lives with wife and 3 kids; NOK:  Wife, [phone]  Chief Complaint: leg swelling  HPI: Christopher Fitzpatrick is a 52 y.o. male without significant past medical history presenting with leg swelling and redness.   He was looking at something under a house and later that afternoon he noticed itching on his leg.  He thought maybe he had scratched his leg or else had an ingrown hair.  Friday AM, he had a dime-sized red spot with pus in it - thought maybe it was poison oak.  By that evening, it was about a quarter size with a white head on it and surrounding blanching.  He went to urgent care and he received a shot of steroids and Keflex.  It has gotten worse despite antibiotics.  Subjective fever, "pretty good temperature."  Night sweats.  Yesterday it started oozing orange fluid.  He feels well otherwise.   ED Course:  Cellulitis - failed outpatient Keflex.  No DVT on [place]. No gas seen on x-ray.  Needs admission for failed outpatient treatment.   Review of Systems: As per HPI; otherwise review of systems reviewed and negative.   Ambulatory Status:  Ambulates without assistance  Past Medical History:  Diagnosis Date  . Elevated blood pressure reading     Past Surgical History:  Procedure Laterality Date  . CARPAL TUNNEL RELEASE Right 11/25/2015   Procedure: CARPAL TUNNEL RELEASE;  Surgeon: Shirlean Kelly, MD;  Location: [facility];  Service: Neurosurgery;  Laterality: Right;  . CARPAL TUNNEL RELEASE Left 12/23/2015   Procedure: CARPAL TUNNEL RELEASE;  Surgeon: Shirlean Kelly, MD;  Location: [facility];  Service: Neurosurgery;  Laterality: Left;  Left Carpal Tunnel Release  . HERNIA REPAIR  1986  . KNEE ARTHROPLASTY Right 1987  . KNEE ARTHROSCOPY Right 5621,3086 left knee  . KNEE RECONSTRUCTION  Right 1985    Social History   Socioeconomic History  . Marital status: Married    Spouse name: Not on file  . Number of children: Not on file  . Years of education: Not on file  . Highest education level: Not on file  Occupational History  . Occupation: Advertising account executive, owns vending company  Social Needs  . Financial resource strain: Not on file  . Food insecurity:    Worry: Not on file    Inability: Not on file  . Transportation needs:    Medical: Not on file    Non-medical: Not on file  Tobacco Use  . Smoking status: Current Some Day Smoker    Packs/day: 1.50    Years: 25.00    Pack years: 37.50    Types: Cigarettes, Cigars, Pipe    Last attempt to quit: 07/27/2015    Years since quitting: 2.3  . Smokeless tobacco: Never Used  . Tobacco comment: now occasionally smokes cigars  Substance and Sexual Activity  . Alcohol use: Yes    Comment: rare  . Drug use: No  . Sexual activity: Not on file  Lifestyle  . Physical activity:    Days per week: Not on file    Minutes per session: Not on file  . Stress: Not on file  Relationships  . Social connections:    Talks on phone: Not on file    Gets  together: Not on file    Attends religious service: Not on file    Active member of club or organization: Not on file    Attends meetings of clubs or organizations: Not on file    Relationship status: Not on file  . Intimate partner violence:    Fear of current or ex partner: Not on file    Emotionally abused: Not on file    Physically abused: Not on file    Forced sexual activity: Not on file  Other Topics Concern  . Not on file  Social History Narrative  . Not on file    No Known Allergies  History reviewed. No pertinent family history.  Prior to Admission medications   Medication Sig Start Date End Date Taking? Authorizing Provider  Misc Natural Products (GLUCOSAMINE CHOND COMPLEX/MSM PO) Take 2 tablets by mouth daily.   Yes [provider]    Physical  Exam: Vitals:   12/03/17 0348 12/03/17 0500 12/03/17 0800 12/03/17 0837  BP: (!) 168/86 (!) 141/83 139/79 (!) 163/97  Pulse: 62 67 71 77  Resp: 18     Temp: 97.8 F (36.6 C)   (!) 97.3 F (36.3 C)  TempSrc:    Oral  SpO2: 93% 99% 100% 100%  Weight:      Height:         General:  Appears calm and comfortable and is NAD Eyes:  PERRL, EOMI, normal lids, iris ENT:  grossly normal hearing, lips & tongue, mmm; appropriate dentition Neck:  no LAD, masses or thyromegaly; no carotid bruits Cardiovascular:  RRR, no m/r/g. No LE edema.  Respiratory:   CTA bilaterally with no wheezes/rales/rhonchi.  Normal respiratory effort. Abdomen:  soft, NT, ND, NABS Back:   normal alignment, no CVAT Skin:  Well demarcated region of erythema extending along majority of left lower leg.  There is an excoriated area along the proximal anterior aspect with orange crusting.  The left lower leg is taut with edema but without apparent area of obvious fluctuance.        Musculoskeletal:  grossly normal tone BUE/BLE, good ROM, no bony abnormality Psychiatric:  grossly normal mood and affect, speech fluent and appropriate, AOx3 Neurologic:  CN 2-12 grossly intact, moves all extremities in coordinated fashion, sensation intact    Radiological Exams on Admission: Dg Tibia/fibula Left  Result Date: 12/03/2017 CLINICAL DATA:  Diffuse erythema about the left lower leg. Assess for subcutaneous air. EXAM: LEFT TIBIA AND FIBULA - 2 VIEW COMPARISON:  Left knee MRI and radiographs performed 04/12/2008 FINDINGS: No soft tissue air is seen. Diffuse soft tissue swelling is noted about the left lower leg. There is no evidence of osseous erosion. There is no evidence of fracture or dislocation. The ankle mortise is grossly unremarkable in appearance. Marginal osteophytes are noted arising at all 3 compartments, without significant joint space narrowing. Mild cortical irregularity is noted at the lateral and patellofemoral  compartments. No knee joint effusion is seen. IMPRESSION: No soft tissue air seen. No evidence of osseous erosion. No evidence of fracture or dislocation. Electronically Signed   By: Roanna RaiderJeffery  Chang M.D.   On: 12/03/2017 06:07    EKG: not done   Labs on Admission: I have personally reviewed the available labs and imaging studies at the time of the admission.  Pertinent labs:   Lactate 1.06 Blood cultures pending Normal CMP, CBC   Assessment/Plan Active Problems:   Left leg cellulitis   Elevated blood pressure reading   Cellulitis -  Patient without significant PMH presenting with LLE cellulitis that is refractory to outpatient management. -Currently with diffuse erythema/edema of left lower leg distal to the knee -There does appear to be an impetigo-like region of excoriation along the proximal aspect -No DVT by [place] -No gas on x-ray -Normal WBC count -He was given Vanc and Zosyn in the ER -Based on lack of improvement with outpatient Keflex, will continue Vanc/Zosyn for now -If improved tomorrow, he likely can be discharged  Elevated BP reading -Reports h/o this in the past without prior diagnosis of HTN -Will trend and monitor -Suggest outpatient f/u even if medication is not started during this hospitalization -Cover with IV prn hydralazine for now   DVT prophylaxis:  Lovenox  Code Status: Full - confirmed with patient/family Family Communication: Wife present throughout evaluation Disposition Plan:  Home once clinically improved Consults called: None  Admission status: It is my clinical opinion that referral for OBSERVATION is reasonable and necessary in this patient based on the above information provided. The aforementioned taken together are felt to place the patient at high risk for further clinical deterioration. However it is anticipated that the patient may be medically stable for discharge from the hospital within 24 to 48 hours.    Jonah Blue MD [facility]  If note is complete, please contact covering daytime or nighttime physician. [email or web address] Password [facility]  12/03/2017, 9:29 AM

## 2017-12-03 NOTE — ED Provider Notes (Signed)
[facility] EMERGENCY DEPARTMENT Provider Note   CSN: [id number] Arrival date & time: 12/02/17  1607    History   Chief Complaint Chief Complaint  Patient presents with  . Leg Swelling  . Wound Check    HPI Christopher Fitzpatrick is a 52 y.o. male.  52 year old male presents to the emergency department for evaluation of left lower extremity swelling and redness.  He reports that he was working in a crawl space on 11/29/2017 when he thought he scratched himself.  He had worsening swelling and redness to the area the following day.  The patient was seen at urgent care and placed on 500 mg Keflex 4 times daily which she has been compliant with since 11/30/2017.  He continues to report worsening redness and swelling to the leg.  Patient also noting subjective fevers a few days ago, though these have improved.  He presented to the emergency department over concern for progressing symptoms.  No associated numbness or weakness.  The patient is not a diabetic.     Past Medical History:  Diagnosis Date  . Medical history non-contributory     Patient Active Problem List   Diagnosis Date Noted  . Left leg cellulitis 12/03/2017    Past Surgical History:  Procedure Laterality Date  . CARPAL TUNNEL RELEASE Right 11/25/2015   Procedure: CARPAL TUNNEL RELEASE;  Surgeon: Shirlean Atilla Zollner, MD;  Location: [facility];  Service: Neurosurgery;  Laterality: Right;  . CARPAL TUNNEL RELEASE Left 12/23/2015   Procedure: CARPAL TUNNEL RELEASE;  Surgeon: Shirlean Melinda Pottinger, MD;  Location: [facility];  Service: Neurosurgery;  Laterality: Left;  Left Carpal Tunnel Release  . HERNIA REPAIR  1986  . KNEE ARTHROPLASTY Right 1987  . KNEE ARTHROSCOPY Right 1324,4010 left knee  . KNEE RECONSTRUCTION Right 1985        Home Medications    Prior to Admission medications   Medication Sig Start Date End Date Taking? Authorizing Provider  HYDROcodone-acetaminophen (NORCO) 5-325 MG tablet Take 1-2  tablets by mouth every 4 (four) hours as needed (pain). 11/25/15   Shirlean Ron Junco, MD    Family History History reviewed. No pertinent family history.  Social History Social History   Tobacco Use  . Smoking status: Former Smoker    Packs/day: 1.50    Years: 25.00    Pack years: 37.50    Types: Cigarettes, Cigars    Last attempt to quit: 07/27/2015    Years since quitting: 2.3  Substance Use Topics  . Alcohol use: No  . Drug use: No     Allergies   Patient has no known allergies.   Review of Systems Review of Systems Ten systems reviewed and are negative for acute change, except as noted in the HPI.    Physical Exam Updated Vital Signs BP (!) 141/83   Pulse 67   Temp 97.8 F (36.6 C)   Resp 18   Ht 5\' 10"  (1.778 m)   Wt 133.4 kg (294 lb)   SpO2 99%   BMI 42.18 kg/m   Physical Exam  Constitutional: He is oriented to person, place, and time. He appears well-developed and well-nourished. No distress.  Nontoxic appearing and in NAD  HENT:  Head: Normocephalic and atraumatic.  Eyes: Conjunctivae and EOM are normal. No scleral icterus.  Neck: Normal range of motion.  Cardiovascular: Normal rate, regular rhythm and intact distal pulses.  DP pulse 2+ in the LLE  Pulmonary/Chest: Effort normal. No respiratory distress.  Musculoskeletal:  Normal range of motion. He exhibits edema.  3+ pitting edema to the LLE with associated erythema and heat to touch. No lymphadenic streaking. No crepitus. Serous crusting noted from initial wound site.  Neurological: He is alert and oriented to person, place, and time. He exhibits normal muscle tone. Coordination normal.  Sensation to light touch intact in the LLE  Skin: Skin is warm and dry. No rash noted. He is not diaphoretic. There is erythema. No pallor.  Psychiatric: He has a normal mood and affect. His behavior is normal.  Nursing note and vitals reviewed.      ED Treatments / Results  Labs (all labs ordered are listed,  but only abnormal results are displayed) Labs Reviewed  CULTURE, BLOOD (ROUTINE X 2)  CULTURE, BLOOD (ROUTINE X 2)  COMPREHENSIVE METABOLIC PANEL  CBC WITH DIFFERENTIAL/PLATELET  I-STAT CG4 LACTIC ACID, ED    EKG None  Radiology No results found.  Procedures Procedures (including critical care time)  Medications Ordered in ED Medications  vancomycin (VANCOCIN) 2,000 mg in sodium chloride 0.9 % 500 mL IVPB (has no administration in time range)  vancomycin (VANCOCIN) IVPB 1000 mg/200 mL premix (has no administration in time range)  piperacillin-tazobactam (ZOSYN) IVPB 3.375 g (has no administration in time range)  hydrALAZINE (APRESOLINE) injection 5 mg (has no administration in time range)  ondansetron (ZOFRAN) injection 4 mg (has no administration in time range)  acetaminophen (TYLENOL) tablet 650 mg (has no administration in time range)  zolpidem (AMBIEN) tablet 5 mg (has no administration in time range)  oxyCODONE-acetaminophen (PERCOCET/ROXICET) 5-325 MG per tablet 1 tablet (has no administration in time range)  0.9 %  sodium chloride infusion (has no administration in time range)  piperacillin-tazobactam (ZOSYN) IVPB 3.375 g (3.375 g Intravenous New Bag/Given 12/03/17 0515)  sodium chloride 0.9 % bolus 1,000 mL (1,000 mLs Intravenous New Bag/Given 12/03/17 0514)     Initial Impression / Assessment and Plan / ED Course  I have reviewed the triage vital signs and the nursing notes.  Pertinent labs & imaging results that were available during my care of the patient were reviewed by me and considered in my medical decision making (see chart for details).     52 year old male presents to the emergency department for progressing left lower extremity cellulitis.  He was placed on oral Keflex by urgent care on 11/30/2017.  He has been taking this antibiotic 4 times a day and denies missing any doses.  He is neurovascularly intact with a very erythematous and edematous lower  extremity.  He did have a venous duplex while in the emergency department today which is negative for DVT.  X-ray ordered to evaluate for subcutaneous gas.  This is pending.  Despite reassuring labs and vital signs, there is underlying concern for failed outpatient management on oral abx.  Plan for observation admission and continued IV antibiotics pending symptomatic improvement.  [facility].   Final Clinical Impressions(s) / ED Diagnoses   Final diagnoses:  Cellulitis of left lower extremity    ED Discharge Orders    None       Antony MaduraHumes, Delara Shepheard, PA-C 12/03/17 0556    Palumbo, April, MD 12/03/17 [phone]

## 2017-12-04 DIAGNOSIS — R030 Elevated blood-pressure reading, without diagnosis of hypertension: Secondary | ICD-10-CM

## 2017-12-04 DIAGNOSIS — F17210 Nicotine dependence, cigarettes, uncomplicated: Secondary | ICD-10-CM | POA: Diagnosis present

## 2017-12-04 DIAGNOSIS — L03116 Cellulitis of left lower limb: Principal | ICD-10-CM

## 2017-12-04 LAB — BASIC METABOLIC PANEL
ANION GAP: 7 (ref 5–15)
BUN: 11 mg/dL (ref 6–20)
CHLORIDE: 106 mmol/L (ref 101–111)
CO2: 27 mmol/L (ref 22–32)
Calcium: 9 mg/dL (ref 8.9–10.3)
Creatinine, Ser: 1.1 mg/dL (ref 0.61–1.24)
GFR calc Af Amer: >60 mL/min (ref >60)
GLUCOSE: 109 mg/dL — AB (ref 65–99)
POTASSIUM: 4.2 mmol/L (ref 3.5–5.1)
SODIUM: 140 mmol/L (ref 135–145)

## 2017-12-04 LAB — CBC
HEMATOCRIT: 40 % (ref 39.0–52.0)
HEMOGLOBIN: 12.9 g/dL — AB (ref 13.0–17.0)
MCH: 29.1 pg (ref 26.0–34.0)
MCHC: 32.3 g/dL (ref 30.0–36.0)
MCV: 90.1 fL (ref 78.0–100.0)
Platelets: 205 10*3/uL (ref 150–400)
RBC: 4.44 MIL/uL (ref 4.22–5.81)
RDW: 12.9 % (ref 11.5–15.5)
WBC: 5.7 10*3/uL (ref 4.0–10.5)

## 2017-12-04 LAB — HIV ANTIBODY (ROUTINE TESTING W REFLEX): HIV Screen 4th Generation wRfx: NONREACTIVE

## 2017-12-04 MED ORDER — SULFAMETHOXAZOLE-TRIMETHOPRIM 400-80 MG PO TABS: 1.0000 | ORAL_TABLET | Freq: Two times a day (BID) | ORAL | 0 refills | Status: DC

## 2017-12-04 MED ORDER — TETANUS-DIPHTH-ACELL PERTUSSIS 5-2.5-18.5 LF-MCG/0.5 IM SUSP
0.5000 mL | Freq: Once | INTRAMUSCULAR | Status: AC
  Administered 2017-12-04: 0.5 mL via INTRAMUSCULAR
  Filled 2017-12-04: qty 0.5

## 2017-12-04 MED ORDER — SULFAMETHOXAZOLE-TRIMETHOPRIM 800-160 MG PO TABS: 1.0000 | ORAL_TABLET | Freq: Two times a day (BID) | ORAL | Status: DC

## 2017-12-04 MED ORDER — MUPIROCIN CALCIUM 2 % EX CREA: TOPICAL_CREAM | Freq: Two times a day (BID) | CUTANEOUS | 0 refills | Status: DC

## 2017-12-04 NOTE — Progress Notes (Signed)
Pt discharged home in stable condition after going over discharge instructions,no concerns voiced. AVS given before leaving unit

## 2017-12-04 NOTE — Discharge Summary (Signed)
Physician Discharge Summary  Christopher HallLee E Kreis [id number] DOB: [birth year] DOA: 12/02/2017  PCP: Patient, No Pcp Per- patient to establish with PCP  Admit date: 12/02/2017 Discharge date: 12/04/2017   Recommendations for Outpatient Follow-Up:   1. Continue abx and leg elevation 2. DASH diet and BP meds if needed   Discharge Diagnosis:   Active Problems:   Left leg cellulitis   Elevated blood pressure reading   Discharge disposition:  Home.   Discharge Condition: Improved.  Diet recommendation: Low sodium, heart healthy  Wound care: None.   History of Present Illness:   Christopher Fitzpatrick is a 52 y.o. male without significant past medical history presenting with leg swelling and redness.   He was looking at something under a house and later that afternoon he noticed itching on his leg.  He thought maybe he had scratched his leg or else had an ingrown hair.  Friday AM, he had a dime-sized red spot with pus in it - thought maybe it was poison oak.  By that evening, it was about a quarter size with a white head on it and surrounding blanching.  He went to urgent care and he received a shot of steroids and Keflex.  It has gotten worse despite antibiotics.  Subjective fever, "pretty good temperature."  Night sweats.  Yesterday it started oozing orange fluid.  He feels well otherwise.     Hospital Course by Problem:   Cellulitis -Patient without significant PMH presenting with LLE cellulitis that is refractory to outpatient management. -much improved -No DVT by US -No gas on x-ray -Normal WBC count -He was given Vanc and Zosyn in the ER -change to PO keflex and PO bactrim  Elevated BP reading -Reports h/o this in the past without prior diagnosis of HTN -DASH diet -outpatient monitoring-- may need medication      Medical Consultants:    None.   Discharge Exam:   Vitals:   12/04/17 0954 12/04/17 1355  BP: (!) 159/86 (!) 160/91  Pulse: 67 64  Resp:  15  Temp: 98  F (36.7 C) 98.1 F (36.7 C)  SpO2: 98% 98%   Vitals:   12/03/17 2250 12/04/17 0447 12/04/17 0954 12/04/17 1355  BP: 132/72 (!) 154/84 (!) 159/86 (!) 160/91  Pulse: 67 64 67 64  Resp: 18 18  15   Temp: 97.9 F (36.6 C) 98.1 F (36.7 C) 98 F (36.7 C) 98.1 F (36.7 C)  TempSrc: Oral Oral Oral Oral  SpO2: 97% 99% 98% 98%  Weight:      Height:        Gen:  NAD    The results of significant diagnostics from this hospitalization (including imaging, microbiology, ancillary and laboratory) are listed below for reference.     Procedures and Diagnostic Studies:   Dg Tibia/fibula Left  Result Date: 12/03/2017 CLINICAL DATA:  Diffuse erythema about the left lower leg. Assess for subcutaneous air. EXAM: LEFT TIBIA AND FIBULA - 2 VIEW COMPARISON:  Left knee MRI and radiographs performed 04/12/2008 FINDINGS: No soft tissue air is seen. Diffuse soft tissue swelling is noted about the left lower leg. There is no evidence of osseous erosion. There is no evidence of fracture or dislocation. The ankle mortise is grossly unremarkable in appearance. Marginal osteophytes are noted arising at all 3 compartments, without significant joint space narrowing. Mild cortical irregularity is noted at the lateral and patellofemoral compartments. No knee joint effusion is seen. IMPRESSION: No soft tissue air seen. No evidence of  osseous erosion. No evidence of fracture or dislocation. Electronically Signed   By: Roanna Raider M.D.   On: 12/03/2017 06:07     Labs:   Basic Metabolic Panel: Recent Labs  Lab 12/02/17 1720 12/04/17 0455  NA 137 140  K 4.3 4.2  CL 103 106  CO2 24 27  GLUCOSE 86 109*  BUN 14 11  CREATININE 1.01 1.10  CALCIUM 9.3 9.0   GFR Estimated Creatinine Clearance: 109.2 mL/min (by C-G formula based on SCr of 1.1 mg/dL). Liver Function Tests: Recent Labs  Lab 12/02/17 1720  AST 30  ALT 32  ALKPHOS 62  BILITOT 0.7  PROT 7.2  ALBUMIN 4.0   No results for input(s): LIPASE,  AMYLASE in the last 168 hours. No results for input(s): AMMONIA in the last 168 hours. Coagulation profile No results for input(s): INR, PROTIME in the last 168 hours.  CBC: Recent Labs  Lab 12/02/17 1720 12/04/17 0455  WBC 9.8 5.7  NEUTROABS 6.8  --   HGB 14.4 12.9*  HCT 43.2 40.0  MCV 88.2 90.1  PLT 208 205   Cardiac Enzymes: No results for input(s): CKTOTAL, CKMB, CKMBINDEX, TROPONINI in the last 168 hours. BNP: Invalid input(s): POCBNP CBG: No results for input(s): GLUCAP in the last 168 hours. D-Dimer No results for input(s): DDIMER in the last 72 hours. Hgb A1c No results for input(s): HGBA1C in the last 72 hours. Lipid Profile No results for input(s): CHOL, HDL, LDLCALC, TRIG, CHOLHDL, LDLDIRECT in the last 72 hours. Thyroid function studies No results for input(s): TSH, T4TOTAL, T3FREE, THYROIDAB in the last 72 hours.  Invalid input(s): FREET3 Anemia work up No results for input(s): VITAMINB12, FOLATE, FERRITIN, TIBC, IRON, RETICCTPCT in the last 72 hours. Microbiology Recent Results (from the past 240 hour(s))  Culture, blood (Routine X 2) w Reflex to ID Panel     Status: None (Preliminary result)   Collection Time: 12/02/17  5:22 PM  Result Value Ref Range Status   Specimen Description BLOOD LEFT ANTECUBITAL  Final   Special Requests   Final    BOTTLES DRAWN AEROBIC AND ANAEROBIC Blood Culture adequate volume   Culture   Final    NO GROWTH 1 DAY Performed at [facility], [address], [place], [place] [address]    Report Status PENDING  Incomplete  Culture, blood (Routine X 2) w Reflex to ID Panel     Status: None (Preliminary result)   Collection Time: 12/03/17  5:30 AM  Result Value Ref Range Status   Specimen Description BLOOD RIGHT ARM ZOSYN STARTED 0515 12/03/17  Final   Special Requests AEROBIC BOTTLE ONLY Blood Culture adequate volume  Final   Culture   Final    NO GROWTH 1 DAY Performed at [facility], [address],  [place], [place] [address]    Report Status PENDING  Incomplete     Discharge Instructions:   Discharge Instructions    Diet - low sodium heart healthy   Complete by:  As directed    Discharge instructions   Complete by:  As directed    For improved healing, keep leg elevated---- bactrim is good for bacteria like staph.  Continue home keflex for strep coverage.   Increase activity slowly   Complete by:  As directed      Allergies as of 12/04/2017   No Known Allergies     Medication List    TAKE these medications   GLUCOSAMINE CHOND COMPLEX/MSM  PO Take 2 tablets by mouth daily.   mupirocin cream 2 % Commonly known as:  BACTROBAN Apply topically 2 (two) times daily.   sulfamethoxazole-trimethoprim 400-80 MG tablet Commonly known as:  BACTRIM,SEPTRA Take 1 tablet by mouth every 12 (twelve) hours.      Follow-up Information    establish with a PCP for follow up for high blood pressure Follow up.            Time coordinating discharge: 35 min  Signed:  Joseph Art   [facility] 12/04/2017, 3:26 PM

## 2017-12-04 NOTE — Discharge Instructions (Signed)

## 2017-12-08 LAB — CULTURE, BLOOD (ROUTINE X 2)
Culture: NO GROWTH
Culture: NO GROWTH
Special Requests: ADEQUATE
Special Requests: ADEQUATE

## 2018-03-28 ENCOUNTER — Emergency Department (HOSPITAL_COMMUNITY)
Admission: EM | Admit: 2018-03-28 | Discharge: 2018-03-28 | Disposition: A | Discharge status: Home / Self Care | Payer: BLUE CROSS/BLUE SHIELD | Attending: Emergency Medicine | Admitting: Emergency Medicine

## 2018-03-28 ENCOUNTER — Other Ambulatory Visit: Payer: Self-pay

## 2018-03-28 ENCOUNTER — Encounter (HOSPITAL_COMMUNITY): Payer: Self-pay | Admitting: *Deleted

## 2018-03-28 DIAGNOSIS — L02415 Cutaneous abscess of right lower limb: Secondary | ICD-10-CM | POA: Insufficient documentation

## 2018-03-28 DIAGNOSIS — F17220 Nicotine dependence, chewing tobacco, uncomplicated: Secondary | ICD-10-CM | POA: Insufficient documentation

## 2018-03-28 DIAGNOSIS — Z79899 Other long term (current) drug therapy: Secondary | ICD-10-CM | POA: Insufficient documentation

## 2018-03-28 DIAGNOSIS — L03115 Cellulitis of right lower limb: Secondary | ICD-10-CM

## 2018-03-28 DIAGNOSIS — L0291 Cutaneous abscess, unspecified: Secondary | ICD-10-CM

## 2018-03-28 LAB — CBC WITH DIFFERENTIAL/PLATELET
BASOS ABS: 0.1 10*3/uL (ref 0.0–0.1)
Basophils Relative: 1 %
EOS PCT: 3 %
Eosinophils Absolute: 0.3 10*3/uL (ref 0.0–0.7)
HCT: 42.5 % (ref 39.0–52.0)
Hemoglobin: 14.5 g/dL (ref 13.0–17.0)
LYMPHS ABS: 2 10*3/uL (ref 0.7–4.0)
LYMPHS PCT: 20 %
MCH: 30.3 pg (ref 26.0–34.0)
MCHC: 34.1 g/dL (ref 30.0–36.0)
MCV: 88.9 fL (ref 78.0–100.0)
MONO ABS: 0.8 10*3/uL (ref 0.1–1.0)
MONOS PCT: 7 %
Neutro Abs: 7.1 10*3/uL (ref 1.7–7.7)
Neutrophils Relative %: 69 %
PLATELETS: 196 10*3/uL (ref 150–400)
RBC: 4.78 MIL/uL (ref 4.22–5.81)
RDW: 12.8 % (ref 11.5–15.5)
WBC: 10.2 10*3/uL (ref 4.0–10.5)

## 2018-03-28 LAB — BASIC METABOLIC PANEL
Anion gap: 9 (ref 5–15)
BUN: 16 mg/dL (ref 6–20)
CO2: 26 mmol/L (ref 22–32)
Calcium: 9 mg/dL (ref 8.9–10.3)
Chloride: 104 mmol/L (ref 98–111)
Creatinine, Ser: 1.06 mg/dL (ref 0.61–1.24)
GFR calc Af Amer: >60 mL/min (ref >60)
GLUCOSE: 141 mg/dL — AB (ref 70–99)
POTASSIUM: 3.7 mmol/L (ref 3.5–5.1)
Sodium: 139 mmol/L (ref 135–145)

## 2018-03-28 MED ORDER — SULFAMETHOXAZOLE-TRIMETHOPRIM 800-160 MG PO TABS
1.0000 | ORAL_TABLET | Freq: Once | ORAL | Status: AC
  Administered 2018-03-28: 1 via ORAL
  Filled 2018-03-28: qty 1

## 2018-03-28 MED ORDER — POVIDONE-IODINE 10 % EX SOLN
CUTANEOUS | Status: AC
  Administered 2018-03-28: 21:00:00
  Filled 2018-03-28: qty 15

## 2018-03-28 MED ORDER — SULFAMETHOXAZOLE-TRIMETHOPRIM 800-160 MG PO TABS: 1.0000 | ORAL_TABLET | Freq: Two times a day (BID) | ORAL | 0 refills | Status: AC

## 2018-03-28 MED ORDER — LIDOCAINE-EPINEPHRINE (PF) 2 %-1:200000 IJ SOLN
10.0000 mL | Freq: Once | INTRAMUSCULAR | Status: AC
  Administered 2018-03-28: 10 mL
  Filled 2018-03-28: qty 20

## 2018-03-28 NOTE — ED Triage Notes (Signed)
Pt states that he was putting a donkey up a few days ago and may have felt something scratch his right lower calf area, unsure of injury, just noticed the area bleeding, area of leg is now red, painful, itching and states that he "just doesn't feel well today"

## 2018-03-28 NOTE — ED Provider Notes (Signed)
[facility] Provider Note   CSN: [id number] Arrival date & time: 03/28/18  1922     History   Chief Complaint Chief Complaint  Patient presents with  . Leg Pain    HPI Christopher Fitzpatrick is a 52 y.o. male.  Pt presents to the ED today with possible cellulitis right lower leg.  Pt said something scratched him when he was putting his donkey away.  He had cellulitis in April which required hospital admission.     Past Medical History:  Diagnosis Date  . Arthritis    "qwhere; knees, ankles, shoulders, hip" (12/03/2017)  . Elevated blood pressure reading   . History of gout   . Left leg cellulitis 12/02/2017    Patient Active Problem List   Diagnosis Date Noted  . Left leg cellulitis 12/03/2017  . Elevated blood pressure reading 12/03/2017    Past Surgical History:  Procedure Laterality Date  . CARPAL TUNNEL RELEASE Right 11/25/2015   Procedure: CARPAL TUNNEL RELEASE;  Surgeon: Shirlean Kellyobert Nudelman, MD;  Location: [facility];  Service: Neurosurgery;  Laterality: Right;  . CARPAL TUNNEL RELEASE Left 12/23/2015   Procedure: CARPAL TUNNEL RELEASE;  Surgeon: Shirlean Kellyobert Nudelman, MD;  Location: [facility];  Service: Neurosurgery;  Laterality: Left;  Left Carpal Tunnel Release  . INGUINAL HERNIA REPAIR Right 1986  . KNEE ARTHROSCOPY Right 1987; 1988; 1989; 1990; ~ 1993"  . KNEE ARTHROSCOPY Left ~ 2014  . KNEE RECONSTRUCTION Right 1985        Home Medications    Prior to Admission medications   Medication Sig Start Date End Date Taking? Authorizing Provider  Misc Natural Products (GLUCOSAMINE CHOND COMPLEX/MSM PO) Take 2 tablets by mouth daily.    [provider]  mupirocin cream (BACTROBAN) 2 % Apply topically 2 (two) times daily. 12/04/17   Joseph ArtVann, Jessica U, DO  sulfamethoxazole-trimethoprim (BACTRIM DS,SEPTRA DS) 800-160 MG tablet Take 1 tablet by mouth 2 (two) times daily for 7 days. 03/28/18 04/04/18  Jacalyn LefevreHaviland, Kyandre Okray, MD    Family History No family  history on file.  Social History Social History   Tobacco Use  . Smoking status: Current Some Day Smoker    Years: 30.00    Types: Cigarettes, Cigars, Pipe    Last attempt to quit: 07/27/2015    Years since quitting: 2.6  . Smokeless tobacco: Former NeurosurgeonUser    Types: Snuff, Dorna BloomChew    Quit date: 1988  Substance Use Topics  . Alcohol use: Yes    Comment: 12/03/2017 "no more than 1 beer q couple months"  . Drug use: No     Allergies   Patient has no known allergies.   Review of Systems Review of Systems  Skin: Positive for rash and wound.  All other systems reviewed and are negative.    Physical Exam Updated Vital Signs BP (!) 162/83 (BP Location: Right Arm)   Pulse (!) 101   Temp 98.4 F (36.9 C) (Oral)   Resp 18   Ht 5\' 10"  (1.778 m)   Wt 136.1 kg   SpO2 100%   BMI 43.05 kg/m   Physical Exam  Constitutional: He is oriented to person, place, and time. He appears well-developed and well-nourished.  HENT:  Head: Normocephalic and atraumatic.  Right Ear: External ear normal.  Left Ear: External ear normal.  Nose: Nose normal.  Mouth/Throat: Oropharynx is clear and moist.  Eyes: Pupils are equal, round, and reactive to light. Conjunctivae and EOM are normal.  Neck:  Normal range of motion. Neck supple.  Cardiovascular: Normal rate, regular rhythm, normal heart sounds and intact distal pulses.  Pulmonary/Chest: Effort normal and breath sounds normal.  Abdominal: Soft. Bowel sounds are normal.  Musculoskeletal: Normal range of motion.  Neurological: He is alert and oriented to person, place, and time.  Skin: Capillary refill takes less than 2 seconds.  See picture  Psychiatric: He has a normal mood and affect. His behavior is normal. Judgment and thought content normal.  Nursing note and vitals reviewed.      ED Treatments / Results  Labs (all labs ordered are listed, but only abnormal results are displayed) Labs Reviewed  BASIC METABOLIC PANEL - Abnormal;  Notable for the following components:      Result Value   Glucose, Bld 141 (*)    All other components within normal limits  CBC WITH DIFFERENTIAL/PLATELET    EKG None  Radiology No results found.  Procedures .Marland KitchenIncision and Drainage Date/Time: 03/28/2018 8:57 PM Performed by: Jacalyn Lefevre, MD Authorized by: Jacalyn Lefevre, MD   Consent:    Consent obtained:  Verbal   Consent given by:  Patient   Risks discussed:  Bleeding, incomplete drainage and pain   Alternatives discussed:  No treatment Location:    Type:  Abscess   Size:  3   Location:  Lower extremity   Lower extremity location:  Leg   Leg location:  R lower leg Pre-procedure details:    Skin preparation:  Betadine Anesthesia (see MAR for exact dosages):    Anesthesia method:  Local infiltration   Local anesthetic:  Lidocaine 2% w/o epi Procedure type:    Complexity:  Simple Procedure details:    Incision types:  Cruciate   Scalpel blade:  11   Wound management:  Probed and deloculated and irrigated with saline   Drainage:  Purulent and bloody   Drainage amount:  Scant   Wound treatment:  Wound left open   Packing materials:  None   (including critical care time)  Medications Ordered in ED Medications  lidocaine-EPINEPHrine (XYLOCAINE W/EPI) 2 %-1:200000 (PF) injection 10 mL (10 mLs Infiltration Given 03/28/18 1948)  sulfamethoxazole-trimethoprim (BACTRIM DS,SEPTRA DS) 800-160 MG per tablet 1 tablet (1 tablet Oral Given 03/28/18 1947)  povidone-iodine (BETADINE) 10 % external solution (  Given by Other 03/28/18 2049)     Initial Impression / Assessment and Plan / ED Course  I have reviewed the triage vital signs and the nursing notes.  Pertinent labs & imaging results that were available during my care of the patient were reviewed by me and considered in my medical decision making (see chart for details).     Pt instructed to return if worse and to f/u with pcp.  Final Clinical Impressions(s) / ED  Diagnoses   Final diagnoses:  Cellulitis of right lower extremity  Abscess    ED Discharge Orders         Ordered    sulfamethoxazole-trimethoprim (BACTRIM DS,SEPTRA DS) 800-160 MG tablet  2 times daily     03/28/18 2043           Jacalyn Lefevre, MD 03/28/18 [phone]

## 2019-03-09 ENCOUNTER — Ambulatory Visit (INDEPENDENT_AMBULATORY_CARE_PROVIDER_SITE_OTHER)
Admission: RE | Admit: 2019-03-09 | Discharge: 2019-03-09 | Disposition: A | Discharge status: Home / Self Care | Payer: Self-pay | Source: Ambulatory Visit

## 2019-03-09 DIAGNOSIS — L03115 Cellulitis of right lower limb: Secondary | ICD-10-CM

## 2019-03-09 MED ORDER — SULFAMETHOXAZOLE-TRIMETHOPRIM 800-160 MG PO TABS: 1.0000 | ORAL_TABLET | Freq: Two times a day (BID) | ORAL | 0 refills | Status: AC

## 2019-03-09 NOTE — ED Provider Notes (Signed)
Virtual Visit via Video Note:  Christopher Fitzpatrick  initiated request for Telemedicine visit with [facility] Urgent [facility] team. I connected with Christopher Fitzpatrick  on 03/09/2019 at 8:39 AM  for a synchronized telemedicine visit using a video enabled HIPPA compliant telemedicine application. I verified that I am speaking with Christopher Fitzpatrick  using two identifiers. Orvan July, NP  was physically located in a [facility] Urgent care site and TILER BRANDIS was located at a different location.   The limitations of evaluation and management by telemedicine as well as the availability of in-person appointments were discussed. Patient was informed that he  may incur a bill ( including co-pay) for this virtual visit encounter. Christopher Fitzpatrick  expressed understanding and gave verbal consent to proceed with virtual visit.     History of Present Illness:Christopher Fitzpatrick  is a 53 y.o. male presents with increased warmth, redness, swelling to the right lower extremity.  Patient has history of cellulitis.  Reporting the area started as poison ivy/oak which resolved.  He is concerned about evolving cellulitis due to previous hospital mission for same.  His symptoms started yesterday.  He put Bactroban on the area last night and saw some improvement overnight.  Denies any significant pain or fevers.  Past Medical History:  Diagnosis Date  . Arthritis    "qwhere; knees, ankles, shoulders, hip" (12/03/2017)  . Elevated blood pressure reading   . History of gout   . Left leg cellulitis 12/02/2017    No Known Allergies      Observations/Objective: GENERAL APPEARANCE: Well developed, well nourished, alert and cooperative, and appears to be in no acute distress. HEAD: normocephalic. Non labored breathing, no dyspnea or distress Skin: Area of redness noted to right lower extremity, consistent with cellulitis PSYCHIATRIC: The mental examination revealed the patient was oriented to person, place, and time. The patient was able to  demonstrate good judgement and reason, without hallucinations, abnormal affect or abnormal behaviors during the examination. Patient is not suicidal     Assessment and Plan: Cellulitis of the RLE. Treating with Bactrim, monitor for worsening symptoms   Follow Up Instructions:Take the Bactrim as prescribed.  If symptoms worsen you need to go the ER.      I discussed the assessment and treatment plan with the patient. The patient was provided an opportunity to ask questions and all were answered. The patient agreed with the plan and demonstrated an understanding of the instructions.   The patient was advised to call back or seek an in-person evaluation if the symptoms worsen or if the condition fails to improve as anticipated.      Orvan July, NP  03/09/2019 8:39 AM         Orvan July, NP 03/09/19 0840

## 2019-03-09 NOTE — Discharge Instructions (Signed)
Treating you for cellulitis Take the medication as prescribed If your symptoms worsen you will need to go to the ER

## 2020-02-24 ENCOUNTER — Ambulatory Visit (INDEPENDENT_AMBULATORY_CARE_PROVIDER_SITE_OTHER): Payer: Managed Care, Other (non HMO) | Admitting: Family Medicine

## 2020-02-24 ENCOUNTER — Ambulatory Visit (INDEPENDENT_AMBULATORY_CARE_PROVIDER_SITE_OTHER): Payer: Managed Care, Other (non HMO)

## 2020-02-24 ENCOUNTER — Other Ambulatory Visit: Payer: Self-pay

## 2020-02-24 ENCOUNTER — Encounter: Payer: Self-pay | Admitting: Family Medicine

## 2020-02-24 VITALS — BP 177/96 | HR 87 | Temp 98.0°F | Ht 70.0 in | Wt 305.6 lb

## 2020-02-24 DIAGNOSIS — I10 Essential (primary) hypertension: Secondary | ICD-10-CM | POA: Diagnosis not present

## 2020-02-24 DIAGNOSIS — M25561 Pain in right knee: Secondary | ICD-10-CM

## 2020-02-24 DIAGNOSIS — Z1211 Encounter for screening for malignant neoplasm of colon: Secondary | ICD-10-CM | POA: Diagnosis not present

## 2020-02-24 DIAGNOSIS — G8929 Other chronic pain: Secondary | ICD-10-CM | POA: Insufficient documentation

## 2020-02-24 DIAGNOSIS — M545 Low back pain, unspecified: Secondary | ICD-10-CM | POA: Insufficient documentation

## 2020-02-24 NOTE — Progress Notes (Signed)
New Patient Office Visit  Assessment & Plan:  1. Chronic pain of right knee - Patient declined medication to help with pain. We did discuss if he wants to know what is going on we need to do imaging which starts with x-ray. - DG Knee 1-2 Views Right  2. Chronic midline low back pain, unspecified whether sciatica present - Patient declined medication to help with pain.  3. Essential hypertension - Patient unwilling to take medication for HTN. Encouraged diet and exercise. Education provided on the DASH diet.   4. Colon cancer screening - Cologuard  Patient declined any lab work.    Follow-up: Return in about 3 months (around 05/26/2020) for HTN.   Hendricks Limes, MSN, APRN, FNP-C [facility]  Subjective:  Patient ID: Christopher Fitzpatrick, male    DOB: 12/01/1965  Age: 54 y.o. MRN: [id number]  Patient Care Team: Loman Brooklyn, FNP as PCP - General (Family Medicine)  CC:  Chief Complaint  Patient presents with  . New Patient (Initial Visit)  . Establish Care  . Knee Pain    Bilateral knee pain.  Patient states it has been on going pain.  . Back Pain    Patient states it has been ongoing.    HPI MILLION MAHARAJ presents to establish care.  He is not transferring as he has not been going to a PCP previously.  Knee Pain: Patient presents with knee pain involving the  bilateral knee (right more than left).  Patient reports he has had knee pain since he had reconstructive surgery at the age of 12, but the pain has been worse since he fell off a hay wagon 4 to 5 years ago.  Current symptoms include giving out, locking and stiffness.  He rates the pain 3-4/10 most days and 7-8/10 on bad days.  He does also have a tender, raised area medially that was not present prior to falling off the wagon.  Pain is aggravated by squatting.  Patient has had prior knee problems. Evaluation to date: None since falling off the wagon. Treatment to date: none - patient does not take  anything at home, not even Tylenol or ibuprofen.  He does not want a referral to an orthopedic as he feels they will just want to do surgery again, but he does want to make sure nothing is wrong.  Back Pain: Patient presents for presents evaluation of low back problems.  Patient states he has known bulging disc and spinal stenosis.  Exacerbating factors identifiable by patient are standing and walking for more than 20 minutes at a time. Treatments so far initiated by patient: none. Previous workup: Patient reports he has seen a back doctor who advised that he needed a cage around his spinal column and suggested he go as long as he can before having this operation, which is what he is doing. Previous treatments: none.  Patient's blood pressure is elevated today.  He does not check his blood pressure at home.  He does not add salt to food but states his wife does cook with it.  He reports his blood pressure does often go up and he works on lifestyle modifications and it comes back down.   Review of Systems  Constitutional: Negative for chills, fever, malaise/fatigue and weight loss.  HENT: Negative for congestion, ear discharge, ear pain, nosebleeds, sinus pain, sore throat and tinnitus.   Eyes: Negative for blurred vision, double vision, pain, discharge and redness.  Respiratory: Negative for  cough, shortness of breath and wheezing.   Cardiovascular: Negative for chest pain, palpitations and leg swelling.  Gastrointestinal: Negative for abdominal pain, constipation, diarrhea, heartburn, nausea and vomiting.  Genitourinary: Negative for dysuria, frequency and urgency.  Musculoskeletal: Positive for back pain and joint pain. Negative for myalgias.  Skin: Negative for rash.  Neurological: Negative for dizziness, seizures, weakness and headaches.  Psychiatric/Behavioral: Negative for depression, substance abuse and suicidal ideas. The patient is not nervous/anxious.    No current outpatient  medications on file.  No Known Allergies  Past Medical History:  Diagnosis Date  . Arthritis    "qwhere; knees, ankles, shoulders, hip" (12/03/2017)  . Elevated blood pressure reading   . History of gout   . Left leg cellulitis 12/02/2017    Past Surgical History:  Procedure Laterality Date  . CARPAL TUNNEL RELEASE Right 11/25/2015   Procedure: CARPAL TUNNEL RELEASE;  Surgeon: Jovita Gamma, MD;  Location: [place] [facility];  Service: Neurosurgery;  Laterality: Right;  . CARPAL TUNNEL RELEASE Left 12/23/2015   Procedure: CARPAL TUNNEL RELEASE;  Surgeon: Jovita Gamma, MD;  Location: [place] [facility];  Service: Neurosurgery;  Laterality: Left;  Left Carpal Tunnel Release  . INGUINAL HERNIA REPAIR Right 1986  . KNEE ARTHROSCOPY Right 1987; 1988; 1989; 1990; ~ 1993"  . KNEE ARTHROSCOPY Left ~ 2014  . KNEE RECONSTRUCTION Right 1985    Family History  Problem Relation Age of Onset  . Multiple myeloma Mother   . Kidney disease Mother   . Diabetes Mother   . Hypertension Mother   . Heart attack Father   . Heart disease Father   . Hypertension Father   . Hyperlipidemia Father   . Alcohol abuse Sister   . Bipolar disorder Sister   . Diabetes Sister   . Hypertension Sister   . Thyroid disease Sister   . Heart murmur Daughter   . Lung cancer Paternal Grandmother     Social History   Socioeconomic History  . Marital status: Married    Spouse name: Not on file  . Number of children: Not on file  . Years of education: Not on file  . Highest education level: Not on file  Occupational History  . Occupation: Scientist, research (life sciences), owns vending company  Tobacco Use  . Smoking status: Current Every Day Smoker    Years: 30.00    Types: Pipe    Last attempt to quit: 07/27/2015    Years since quitting: 4.5  . Smokeless tobacco: Former Systems developer    Types: Snuff, Sarina Ser    Quit date: 1988  Vaping Use  . Vaping Use: Never used  Substance and Sexual Activity  . Alcohol use: Yes    Comment: 12/03/2017  "no more than 1 beer q couple months"  . Drug use: No  . Sexual activity: Yes    Birth control/protection: None  Other Topics Concern  . Not on file  Social History Narrative  . Not on file   Social Determinants of Health   Financial Resource Strain:   . Difficulty of Paying Living Expenses:   Food Insecurity:   . Worried About Charity fundraiser in the Last Year:   . Arboriculturist in the Last Year:   Transportation Needs:   . Film/video editor (Medical):   Marland Kitchen Lack of Transportation (Non-Medical):   Physical Activity:   . Days of Exercise per Week:   . Minutes of Exercise per Session:   Stress:   . Feeling  of Stress :   Social Connections:   . Frequency of Communication with Friends and Family:   . Frequency of Social Gatherings with Friends and Family:   . Attends Religious Services:   . Active Member of Clubs or Organizations:   . Attends Archivist Meetings:   Marland Kitchen Marital Status:   Intimate Partner Violence:   . Fear of Current or Ex-Partner:   . Emotionally Abused:   Marland Kitchen Physically Abused:   . Sexually Abused:     Objective:   Today's Vitals: BP (!) 177/96   Pulse 87   Temp 98 F (36.7 C) (Temporal)   Ht 5' 10"  (1.778 m)   Wt (!) 305 lb 9.6 oz (138.6 kg)   SpO2 93%   BMI 43.85 kg/m   Physical Exam Vitals reviewed.  Constitutional:      General: He is not in acute distress.    Appearance: Normal appearance. He is morbidly obese. He is not ill-appearing, toxic-appearing or diaphoretic.  HENT:     Head: Normocephalic and atraumatic.  Eyes:     General: No scleral icterus.       Right eye: No discharge.        Left eye: No discharge.     Conjunctiva/sclera: Conjunctivae normal.  Cardiovascular:     Rate and Rhythm: Normal rate and regular rhythm.     Heart sounds: Normal heart sounds. No murmur heard.  No friction rub. No gallop.   Pulmonary:     Effort: Pulmonary effort is normal. No respiratory distress.     Breath sounds: Normal  breath sounds. No stridor. No wheezing, rhonchi or rales.  Musculoskeletal:        General: Normal range of motion.     Cervical back: Normal range of motion.     Right knee: Swelling (above/lateral which patient states was previously told was scar tissue) and deformity (below/medial which is where he is tender to palpation) present. No effusion, erythema, ecchymosis, lacerations or bony tenderness. Normal range of motion. Tenderness present over the medial joint line.  Skin:    General: Skin is warm and dry.  Neurological:     Mental Status: He is alert and oriented to person, place, and time. Mental status is at baseline.  Psychiatric:        Mood and Affect: Mood normal.        Behavior: Behavior normal.        Thought Content: Thought content normal.        Judgment: Judgment normal.

## 2020-02-24 NOTE — Patient Instructions (Signed)
DASH Eating Plan DASH stands for "Dietary Approaches to Stop Hypertension." The DASH eating plan is a healthy eating plan that has been shown to reduce high blood pressure (hypertension). It may also reduce your risk for type 2 diabetes, heart disease, and stroke. The DASH eating plan may also help with weight loss. What are tips for following this plan?  General guidelines  Avoid eating more than 2,300 mg (milligrams) of salt (sodium) a day. If you have hypertension, you may need to reduce your sodium intake to 1,500 mg a day.  Limit alcohol intake to no more than 1 drink a day for nonpregnant women and 2 drinks a day for men. One drink equals 12 oz of beer, 5 oz of wine, or 1 oz of hard liquor.  Work with your health care provider to maintain a healthy body weight or to lose weight. Ask what an ideal weight is for you.  Get at least 30 minutes of exercise that causes your heart to beat faster (aerobic exercise) most days of the week. Activities may include walking, swimming, or biking.  Work with your health care provider or diet and nutrition specialist (dietitian) to adjust your eating plan to your individual calorie needs. Reading food labels   Check food labels for the amount of sodium per serving. Choose foods with less than 5 percent of the Daily Value of sodium. Generally, foods with less than 300 mg of sodium per serving fit into this eating plan.  To find whole grains, look for the word "whole" as the first word in the ingredient list. Shopping  Buy products labeled as "low-sodium" or "no salt added."  Buy fresh foods. Avoid canned foods and premade or frozen meals. Cooking  Avoid adding salt when cooking. Use salt-free seasonings or herbs instead of table salt or sea salt. Check with your health care provider or pharmacist before using salt substitutes.  Do not fry foods. Cook foods using healthy methods such as baking, boiling, grilling, and broiling instead.  Cook with  heart-healthy oils, such as olive, canola, soybean, or sunflower oil. Meal planning  Eat a balanced diet that includes: ? 5 or more servings of fruits and vegetables each day. At each meal, try to fill half of your plate with fruits and vegetables. ? Up to 6-8 servings of whole grains each day. ? Less than 6 oz of lean meat, poultry, or fish each day. A 3-oz serving of meat is about the same size as a deck of cards. One egg equals 1 oz. ? 2 servings of low-fat dairy each day. ? A serving of nuts, seeds, or beans 5 times each week. ? Heart-healthy fats. Healthy fats called Omega-3 fatty acids are found in foods such as flaxseeds and coldwater fish, like sardines, salmon, and mackerel.  Limit how much you eat of the following: ? Canned or prepackaged foods. ? Food that is high in trans fat, such as fried foods. ? Food that is high in saturated fat, such as fatty meat. ? Sweets, desserts, sugary drinks, and other foods with added sugar. ? Full-fat dairy products.  Do not salt foods before eating.  Try to eat at least 2 vegetarian meals each week.  Eat more home-cooked food and less restaurant, buffet, and fast food.  When eating at a restaurant, ask that your food be prepared with less salt or no salt, if possible. What foods are recommended? The items listed may not be a complete list. Talk with your dietitian about   what dietary choices are best for you. Grains Whole-grain or whole-wheat bread. Whole-grain or whole-wheat pasta. Brown rice. Oatmeal. Quinoa. Bulgur. Whole-grain and low-sodium cereals. Pita bread. Low-fat, low-sodium crackers. Whole-wheat flour tortillas. Vegetables Fresh or frozen vegetables (raw, steamed, roasted, or grilled). Low-sodium or reduced-sodium tomato and vegetable juice. Low-sodium or reduced-sodium tomato sauce and tomato paste. Low-sodium or reduced-sodium canned vegetables. Fruits All fresh, dried, or frozen fruit. Canned fruit in natural juice (without  added sugar). Meat and other protein foods Skinless chicken or turkey. Ground chicken or turkey. Pork with fat trimmed off. Fish and seafood. Egg whites. Dried beans, peas, or lentils. Unsalted nuts, nut butters, and seeds. Unsalted canned beans. Lean cuts of beef with fat trimmed off. Low-sodium, lean deli meat. Dairy Low-fat (1%) or fat-free (skim) milk. Fat-free, low-fat, or reduced-fat cheeses. Nonfat, low-sodium ricotta or cottage cheese. Low-fat or nonfat yogurt. Low-fat, low-sodium cheese. Fats and oils Soft margarine without trans fats. Vegetable oil. Low-fat, reduced-fat, or light mayonnaise and salad dressings (reduced-sodium). Canola, safflower, olive, soybean, and sunflower oils. Avocado. Seasoning and other foods Herbs. Spices. Seasoning mixes without salt. Unsalted popcorn and pretzels. Fat-free sweets. What foods are not recommended? The items listed may not be a complete list. Talk with your dietitian about what dietary choices are best for you. Grains Baked goods made with fat, such as croissants, muffins, or some breads. Dry pasta or rice meal packs. Vegetables Creamed or fried vegetables. Vegetables in a cheese sauce. Regular canned vegetables (not low-sodium or reduced-sodium). Regular canned tomato sauce and paste (not low-sodium or reduced-sodium). Regular tomato and vegetable juice (not low-sodium or reduced-sodium). Pickles. Olives. Fruits Canned fruit in a light or heavy syrup. Fried fruit. Fruit in cream or butter sauce. Meat and other protein foods Fatty cuts of meat. Ribs. Fried meat. Bacon. Sausage. Bologna and other processed lunch meats. Salami. Fatback. Hotdogs. Bratwurst. Salted nuts and seeds. Canned beans with added salt. Canned or smoked fish. Whole eggs or egg yolks. Chicken or turkey with skin. Dairy Whole or 2% milk, cream, and half-and-half. Whole or full-fat cream cheese. Whole-fat or sweetened yogurt. Full-fat cheese. Nondairy creamers. Whipped toppings.  Processed cheese and cheese spreads. Fats and oils Butter. Stick margarine. Lard. Shortening. Ghee. Bacon fat. Tropical oils, such as coconut, palm kernel, or palm oil. Seasoning and other foods Salted popcorn and pretzels. Onion salt, garlic salt, seasoned salt, table salt, and sea salt. Worcestershire sauce. Tartar sauce. Barbecue sauce. Teriyaki sauce. Soy sauce, including reduced-sodium. Steak sauce. Canned and packaged gravies. Fish sauce. Oyster sauce. Cocktail sauce. Horseradish that you find on the shelf. Ketchup. Mustard. Meat flavorings and tenderizers. Bouillon cubes. Hot sauce and Tabasco sauce. Premade or packaged marinades. Premade or packaged taco seasonings. Relishes. Regular salad dressings. Where to find more information:  National Heart, Lung, and Blood Institute: www.nhlbi.nih.gov  American Heart Association: www.heart.org Summary  The DASH eating plan is a healthy eating plan that has been shown to reduce high blood pressure (hypertension). It may also reduce your risk for type 2 diabetes, heart disease, and stroke.  With the DASH eating plan, you should limit salt (sodium) intake to 2,300 mg a day. If you have hypertension, you may need to reduce your sodium intake to 1,500 mg a day.  When on the DASH eating plan, aim to eat more fresh fruits and vegetables, whole grains, lean proteins, low-fat dairy, and heart-healthy fats.  Work with your health care provider or diet and nutrition specialist (dietitian) to adjust your eating plan to your   individual calorie needs. This information is not intended to replace advice given to you by your health care provider. Make sure you discuss any questions you have with your health care provider. Document Revised: 07/19/2017 Document Reviewed: 07/30/2016 Elsevier Patient Education  2020 Elsevier Inc.  

## 2020-03-30 LAB — COLOGUARD: COLOGUARD: NEGATIVE

## 2020-04-04 LAB — COLOGUARD: Cologuard: NEGATIVE

## 2020-05-26 ENCOUNTER — Ambulatory Visit (INDEPENDENT_AMBULATORY_CARE_PROVIDER_SITE_OTHER): Payer: Managed Care, Other (non HMO) | Admitting: Family Medicine

## 2020-05-26 ENCOUNTER — Other Ambulatory Visit: Payer: Self-pay

## 2020-05-26 ENCOUNTER — Encounter: Payer: Self-pay | Admitting: Family Medicine

## 2020-05-26 VITALS — BP 166/97 | HR 68 | Temp 97.3°F | Ht 70.0 in | Wt 301.0 lb

## 2020-05-26 DIAGNOSIS — I10 Essential (primary) hypertension: Secondary | ICD-10-CM

## 2020-05-26 MED ORDER — LISINOPRIL 10 MG PO TABS: 10.0000 mg | ORAL_TABLET | Freq: Every day | ORAL | 2 refills | Status: DC

## 2020-05-26 NOTE — Progress Notes (Signed)
Assessment & Plan:  1. Essential hypertension - Uncontrolled.  Started patient on lisinopril 10 mg once daily.  Diet and exercise encouraged.  He needs lab work at his follow-up visit.  He has previously declined all lab work.  I explained we need to check labs after starting this medication and he is agreeable. - lisinopril (ZESTRIL) 10 MG tablet; Take 1 tablet (10 mg total) by mouth daily.  Dispense: 30 tablet; Refill: 2   Return in about 6 weeks (around 07/07/2020) for HTN.  Deliah Boston, MSN, APRN, FNP-C [facility]  Subjective:    Patient ID: Christopher Fitzpatrick, male    DOB: July 21, 1966, 54 y.o.   MRN: [id number]  Patient Care Team: Gwenlyn Fudge, FNP as PCP - General (Family Medicine)   Chief Complaint:  Chief Complaint  Patient presents with  . Hypertension    3 month follow up     HPI: Christopher Fitzpatrick is a 54 y.o. male presenting on 05/26/2020 for Hypertension (3 month follow up )  Patient is here for follow-up of hypertension.  At his previous visit he was not agreeable to starting a medication.  He was encouraged to exercise and eat a low-salt diet.  He does not add salt to his foods.  He is not exercising.  New complaints: None  Social history:  Relevant past medical, surgical, family and social history reviewed and updated as indicated. Interim medical history since our last visit reviewed.  Allergies and medications reviewed and updated.  DATA REVIEWED: CHART IN EPIC  ROS: Negative unless specifically indicated above in HPI.   No current outpatient medications on file.   No Known Allergies Past Medical History:  Diagnosis Date  . Arthritis    "qwhere; knees, ankles, shoulders, hip" (12/03/2017)  . Elevated blood pressure reading   . History of gout   . Left leg cellulitis 12/02/2017    Past Surgical History:  Procedure Laterality Date  . CARPAL TUNNEL RELEASE Right 11/25/2015   Procedure: CARPAL TUNNEL RELEASE;  Surgeon: Shirlean Kelly, MD;  Location: [facility];  Service: Neurosurgery;  Laterality: Right;  . CARPAL TUNNEL RELEASE Left 12/23/2015   Procedure: CARPAL TUNNEL RELEASE;  Surgeon: Shirlean Kelly, MD;  Location: [facility];  Service: Neurosurgery;  Laterality: Left;  Left Carpal Tunnel Release  . INGUINAL HERNIA REPAIR Right 1986  . KNEE ARTHROSCOPY Right 1987; 1988; 1989; 1990; ~ 1993"  . KNEE ARTHROSCOPY Left ~ 2014  . KNEE RECONSTRUCTION Right 1985    Social History   Socioeconomic History  . Marital status: Married    Spouse name: Not on file  . Number of children: Not on file  . Years of education: Not on file  . Highest education level: Not on file  Occupational History  . Occupation: Advertising account executive, owns vending company  Tobacco Use  . Smoking status: Current Every Day Smoker    Years: 30.00    Types: Pipe    Last attempt to quit: 07/27/2015    Years since quitting: 4.8  . Smokeless tobacco: Former Neurosurgeon    Types: Snuff, Dorna Bloom    Quit date: 1988  Vaping Use  . Vaping Use: Never used  Substance and Sexual Activity  . Alcohol use: Yes    Comment: 12/03/2017 "no more than 1 beer q couple months"  . Drug use: No  . Sexual activity: Yes    Birth control/protection: None  Other Topics Concern  . Not on file  Social History Narrative  . Not on file   Social Determinants of Health   Financial Resource Strain:   . Difficulty of Paying Living Expenses: Not on file  Food Insecurity:   . Worried About Programme researcher, broadcasting/film/video in the Last Year: Not on file  . Ran Out of Food in the Last Year: Not on file  Transportation Needs:   . Lack of Transportation (Medical): Not on file  . Lack of Transportation (Non-Medical): Not on file  Physical Activity:   . Days of Exercise per Week: Not on file  . Minutes of Exercise per Session: Not on file  Stress:   . Feeling of Stress : Not on file  Social Connections:   . Frequency of Communication with Friends and Family: Not on file  . Frequency of  Social Gatherings with Friends and Family: Not on file  . Attends Religious Services: Not on file  . Active Member of Clubs or Organizations: Not on file  . Attends Banker Meetings: Not on file  . Marital Status: Not on file  Intimate Partner Violence:   . Fear of Current or Ex-Partner: Not on file  . Emotionally Abused: Not on file  . Physically Abused: Not on file  . Sexually Abused: Not on file        Objective:    BP (!) 154/101   Pulse 68   Temp (!) 97.3 F (36.3 C) (Temporal)   Ht 5\' 10"  (1.778 m)   Wt (!) 301 lb (136.5 kg)   SpO2 97%   BMI 43.19 kg/m   Wt Readings from Last 3 Encounters:  05/26/20 (!) 301 lb (136.5 kg)  02/24/20 (!) 305 lb 9.6 oz (138.6 kg)  03/28/18 300 lb (136.1 kg)    Physical Exam Vitals reviewed.  Constitutional:      General: He is not in acute distress.    Appearance: Normal appearance. He is morbidly obese. He is not ill-appearing, toxic-appearing or diaphoretic.  HENT:     Head: Normocephalic and atraumatic.  Eyes:     General: No scleral icterus.       Right eye: No discharge.        Left eye: No discharge.     Conjunctiva/sclera: Conjunctivae normal.  Cardiovascular:     Rate and Rhythm: Normal rate and regular rhythm.     Heart sounds: Normal heart sounds. No murmur heard.  No friction rub. No gallop.   Pulmonary:     Effort: Pulmonary effort is normal. No respiratory distress.     Breath sounds: Normal breath sounds. No stridor. No wheezing, rhonchi or rales.  Musculoskeletal:        General: Normal range of motion.     Cervical back: Normal range of motion.  Skin:    General: Skin is warm and dry.  Neurological:     Mental Status: He is alert and oriented to person, place, and time. Mental status is at baseline.  Psychiatric:        Mood and Affect: Mood normal.        Behavior: Behavior normal.        Thought Content: Thought content normal.        Judgment: Judgment normal.     No results found for:  TSH Lab Results  Component Value Date   WBC 10.2 03/28/2018   HGB 14.5 03/28/2018   HCT 42.5 03/28/2018   MCV 88.9 03/28/2018   PLT 196 03/28/2018   Lab  Results  Component Value Date   NA 139 03/28/2018   K 3.7 03/28/2018   CO2 26 03/28/2018   GLUCOSE 141 (H) 03/28/2018   BUN 16 03/28/2018   CREATININE 1.06 03/28/2018   BILITOT 0.7 12/02/2017   ALKPHOS 62 12/02/2017   AST 30 12/02/2017   ALT 32 12/02/2017   PROT 7.2 12/02/2017   ALBUMIN 4.0 12/02/2017   CALCIUM 9.0 03/28/2018   ANIONGAP 9 03/28/2018   No results found for: CHOL No results found for: HDL No results found for: LDLCALC No results found for: TRIG No results found for: CHOLHDL No results found for: ACZY6A

## 2020-05-28 ENCOUNTER — Encounter: Payer: Self-pay | Admitting: Family Medicine

## 2020-06-25 ENCOUNTER — Other Ambulatory Visit: Payer: Self-pay

## 2020-06-25 ENCOUNTER — Emergency Department (HOSPITAL_COMMUNITY): Payer: Managed Care, Other (non HMO)

## 2020-06-25 ENCOUNTER — Emergency Department (HOSPITAL_COMMUNITY)
Admission: EM | Admit: 2020-06-25 | Discharge: 2020-06-25 | Disposition: A | Discharge status: Home / Self Care | Payer: Managed Care, Other (non HMO) | Attending: Emergency Medicine | Admitting: Emergency Medicine

## 2020-06-25 ENCOUNTER — Encounter (HOSPITAL_COMMUNITY): Payer: Self-pay | Admitting: Emergency Medicine

## 2020-06-25 DIAGNOSIS — I10 Essential (primary) hypertension: Secondary | ICD-10-CM | POA: Insufficient documentation

## 2020-06-25 DIAGNOSIS — L03115 Cellulitis of right lower limb: Secondary | ICD-10-CM | POA: Insufficient documentation

## 2020-06-25 DIAGNOSIS — F17290 Nicotine dependence, other tobacco product, uncomplicated: Secondary | ICD-10-CM | POA: Diagnosis not present

## 2020-06-25 DIAGNOSIS — M25561 Pain in right knee: Secondary | ICD-10-CM | POA: Diagnosis present

## 2020-06-25 DIAGNOSIS — M7989 Other specified soft tissue disorders: Secondary | ICD-10-CM

## 2020-06-25 DIAGNOSIS — Z79899 Other long term (current) drug therapy: Secondary | ICD-10-CM | POA: Insufficient documentation

## 2020-06-25 DIAGNOSIS — M25461 Effusion, right knee: Secondary | ICD-10-CM

## 2020-06-25 MED ORDER — CLINDAMYCIN HCL 150 MG PO CAPS: 300.0000 mg | ORAL_CAPSULE | Freq: Three times a day (TID) | ORAL | 0 refills | Status: AC

## 2020-06-25 MED ORDER — CLINDAMYCIN HCL 300 MG PO CAPS
300.0000 mg | ORAL_CAPSULE | Freq: Once | ORAL | Status: AC
  Administered 2020-06-25: 300 mg via ORAL
  Filled 2020-06-25: qty 1

## 2020-06-25 NOTE — ED Provider Notes (Signed)
[facility] DEPT Provider Note   CSN: [id number] Arrival date & time: 06/25/20  2201     History Chief Complaint  Patient presents with  . Knee Pain  . Leg Swelling    Christopher Fitzpatrick is a 54 y.o. male presenting for evaluation of R leg and knee pain.   Patient states yesterday he developed swelling of his medial right knee.  Since then, his swelling has extended over the anterior knee and into his right leg.  He also reports erythema and warmth.  He reports some mild discomfort due to the swelling, but no pain.  He denies fevers or chills.  No trauma or injury.  No numbness or tingling.  He states he has a history of cellulitis, this feels similar.  His wife was concerned he had a blood clot, although he reports no calf pain.  He recently started on blood pressure medication, he has no other medical problems and takes no medications daily.  He has a history of ACL repair in this knee when he was 54 years old, does not follow-up with orthopedics.  HPI     Past Medical History:  Diagnosis Date  . Arthritis    "qwhere; knees, ankles, shoulders, hip" (12/03/2017)  . Elevated blood pressure reading   . History of gout   . Left leg cellulitis 12/02/2017    Patient Active Problem List   Diagnosis Date Noted  . Essential hypertension 02/24/2020  . Chronic midline low back pain 02/24/2020  . Chronic pain of right knee 02/24/2020    Past Surgical History:  Procedure Laterality Date  . CARPAL TUNNEL RELEASE Right 11/25/2015   Procedure: CARPAL TUNNEL RELEASE;  Surgeon: Jovita Gamma, MD;  Location: [facility];  Service: Neurosurgery;  Laterality: Right;  . CARPAL TUNNEL RELEASE Left 12/23/2015   Procedure: CARPAL TUNNEL RELEASE;  Surgeon: Jovita Gamma, MD;  Location: [facility];  Service: Neurosurgery;  Laterality: Left;  Left Carpal Tunnel Release  . INGUINAL HERNIA REPAIR Right 1986  . KNEE ARTHROSCOPY Right 1987; 1988; 1989; 1990; ~ 1993"  . KNEE  ARTHROSCOPY Left ~ 2014  . KNEE RECONSTRUCTION Right 1985       Family History  Problem Relation Age of Onset  . Multiple myeloma Mother   . Kidney disease Mother   . Diabetes Mother   . Hypertension Mother   . Heart attack Father   . Heart disease Father   . Hypertension Father   . Hyperlipidemia Father   . Alcohol abuse Sister   . Bipolar disorder Sister   . Diabetes Sister   . Hypertension Sister   . Thyroid disease Sister   . Heart murmur Daughter   . Lung cancer Paternal Grandmother     Social History   Tobacco Use  . Smoking status: Current Every Day Smoker    Years: 30.00    Types: Pipe    Last attempt to quit: 07/27/2015    Years since quitting: 4.9  . Smokeless tobacco: Former Systems developer    Types: Snuff, Sarina Ser    Quit date: 1988  Vaping Use  . Vaping Use: Never used  Substance Use Topics  . Alcohol use: Yes    Comment: 12/03/2017 "no more than 1 beer q couple months"  . Drug use: No    Home Medications Prior to Admission medications   Medication Sig Start Date End Date Taking? Authorizing Provider  clindamycin (CLEOCIN) 150 MG capsule Take 2 capsules (300 mg total) by  mouth 3 (three) times daily for 7 days. 06/25/20 07/02/20  Adylin Hankey, PA-C  lisinopril (ZESTRIL) 10 MG tablet Take 1 tablet (10 mg total) by mouth daily. 05/26/20   Loman Brooklyn, FNP    Allergies    Patient has no known allergies.  Review of Systems   Review of Systems  Musculoskeletal: Positive for joint swelling.  Skin: Positive for color change.  All other systems reviewed and are negative.   Physical Exam Updated Vital Signs BP (!) 147/81 (BP Location: Right Arm)   Pulse 85   Temp 98.4 F (36.9 C) (Oral)   Resp 20   Ht $R'5\' 10"'xk$  (1.778 m)   Wt (!) 136.5 kg   SpO2 96%   BMI 43.19 kg/m   Physical Exam Vitals and nursing note reviewed.  Constitutional:      General: He is not in acute distress.    Appearance: He is well-developed.     Comments: Resting comfortably in  the bed in no acute distress  HENT:     Head: Normocephalic and atraumatic.  Eyes:     Conjunctiva/sclera: Conjunctivae normal.     Pupils: Pupils are equal, round, and reactive to light.  Cardiovascular:     Rate and Rhythm: Normal rate and regular rhythm.     Pulses: Normal pulses.  Pulmonary:     Effort: Pulmonary effort is normal. No respiratory distress.     Breath sounds: Normal breath sounds. No wheezing.  Abdominal:     General: There is no distension.     Palpations: Abdomen is soft. There is no mass.     Tenderness: There is no abdominal tenderness. There is no guarding or rebound.  Musculoskeletal:        General: Swelling present.     Cervical back: Normal range of motion and neck supple.     Comments: Swelling of the right knee and lower leg.  Erythema and warmth of the anterior shin and knee.  No pain erythema or warmth of the calf or popliteal knee.  Patient with good range of motion, limited only by swelling.  Ambulatory and able to weight-bear without pain.  Pedal pulse 2+ bilaterally.  No laceration or cut.  Skin:    General: Skin is warm and dry.     Capillary Refill: Capillary refill takes less than 2 seconds.  Neurological:     Mental Status: He is alert and oriented to person, place, and time.     ED Results / Procedures / Treatments   Labs (all labs ordered are listed, but only abnormal results are displayed) Labs Reviewed - No data to display  EKG None  Radiology DG Knee Complete 4 Views Right  Result Date: 06/25/2020 CLINICAL DATA:  Swelling and pain EXAM: RIGHT KNEE - COMPLETE 4+ VIEW COMPARISON:  February 24, 2020 FINDINGS: The patient appears to be status post prior ACL repair. There are end-stage degenerative changes of the right knee. There is a moderate-sized joint effusion is with debris. There is no acute displaced fracture or dislocation. There is soft tissue swelling about the knee. IMPRESSION: 1. No acute displaced fracture or dislocation. 2.  End-stage degenerative changes of the right knee. 3. Moderate-sized joint effusion with debris. Electronically Signed   By: Constance Holster M.D.   On: 06/25/2020 22:34    Procedures Procedures (including critical care time)  Medications Ordered in ED Medications  clindamycin (CLEOCIN) capsule 300 mg (300 mg Oral Given 06/25/20 2304)    ED Course  I have reviewed the triage vital signs and the nursing notes.  Pertinent labs & imaging results that were available during my care of the patient were reviewed by me and considered in my medical decision making (see chart for details).    MDM Rules/Calculators/A&P                          Patient presenting for evaluation of swelling and erythema of the right knee and lower leg.  On exam, patient appears nontoxic.  He does not have significant pain, no fevers, he is able to range and ambulate without pain.  As such, low suspicion for septic joint.  However, there do appear to be signs of infection, likely cellulitis.  This extends on both sides of the knee, I am concerned about attempting arthrocentesis through what appears to be infected skin.  X-ray obtained from triage read interpreted by me, shows significant arthritis.  Mild joint effusion.  Case discussed with attending, Dr. Regenia Skeeter evaluated the patient.  Discussed findings with patient and wife.  Discussed likely cellulitis, however at this time we cannot rule out diagnosis of septic joint.  Discussed option of arthrocentesis despite risk of penetrating through what may be infected skin.  Patient and wife decide to treat for cellulitis with strict return precautions, returning to the ER with any worsening symptoms especially signs of a septic joint.  Based on patient's presentation, location of pain, redness, and swelling, low suspicion for blood clot.  Discussed with patient and wife, they are agreeable with not obtaining an outpatient ultrasound.  First dose of antibiotics given in the ED.   Patient also given information for follow-up with orthopedics.  At this time, patient appears safe for discharge.  Return precautions given.  Patient states he understands and agrees to plan  Final Clinical Impression(s) / ED Diagnoses Final diagnoses:  Cellulitis of right leg  Leg swelling  Effusion of right knee    Rx / DC Orders ED Discharge Orders         Ordered    clindamycin (CLEOCIN) 150 MG capsule  3 times daily        06/25/20 2252           Franchot Heidelberg, PA-C 06/25/20 2340    Sherwood Gambler, MD 06/25/20 2351

## 2020-06-25 NOTE — Discharge Instructions (Signed)
Take antibiotics as prescribed.  Take the entire course. Use Tylenol or ibuprofen as needed for pain. Follow-up with orthopedic doctor listed below as needed for further evaluation. Return to the emergency room with any worsening of your symptoms.  Return if you develop fever, difficulty walking, severe worsening pain, difficulty bending your knee, or any new, or concerning symptoms.

## 2020-06-25 NOTE — ED Triage Notes (Signed)
Patient states that he has intermittent swelling in his right knee, but yesterday the pain and swelling got worse. Patient states the interior aspect of his knee has been swelling for a while. Noted to be a hard area to the right knee. Swelling noted down the calf as well. No redness, but area is warm to the touch.

## 2020-06-29 ENCOUNTER — Telehealth: Payer: Self-pay | Admitting: Family Medicine

## 2020-06-29 NOTE — Telephone Encounter (Signed)
Pt stated that he had to go to the emergency room and he thinks that the antibiotic that he was given there is not working. They told him if it did not get better that he would need it flushed out and he is wanting to know what to do if it does not get better and if he should go back to the emergency department.

## 2020-06-29 NOTE — Telephone Encounter (Signed)
Patient states that he was seen in the ER on 06/25/20 for infection in knee and provider there wanted to draw fluid off knee but would just push infection into the joint.  ER provider advised patient if not improved in 3-4 days he should be reevaluated by ortho patient has not seen ortho previously and would need a referral.  Patient states that swelling and redness is better in the morning but after working the redness and heat comes back.  MyChart video visit made for 11/11 with Dr. Louanne Skye to further evaluated and place referral

## 2020-06-30 ENCOUNTER — Telehealth (INDEPENDENT_AMBULATORY_CARE_PROVIDER_SITE_OTHER): Payer: Managed Care, Other (non HMO) | Admitting: Family Medicine

## 2020-06-30 ENCOUNTER — Encounter: Payer: Self-pay | Admitting: Family Medicine

## 2020-06-30 DIAGNOSIS — L03113 Cellulitis of right upper limb: Secondary | ICD-10-CM | POA: Diagnosis not present

## 2020-06-30 DIAGNOSIS — M25561 Pain in right knee: Secondary | ICD-10-CM

## 2020-06-30 NOTE — Progress Notes (Signed)
Virtual Visit via MyChart video note  I connected with Christopher Fitzpatrick on 06/30/20 at 1700 by video and verified that I am speaking with the correct person using two identifiers. Christopher Fitzpatrick is currently located at home and patient are currently with her during visit. The provider, Elige Radon Kymberlie Brazeau, MD is located in their office at time of visit.  Call ended at 1718  I discussed the limitations, risks, security and privacy concerns of performing an evaluation and management service by video and the availability of in person appointments. I also discussed with the patient that there may be a patient responsible charge related to this service. The patient expressed understanding and agreed to proceed.   History and Present Illness: Patient is calling in for knee pain and swelling. He has warmth and swelling in that knee.  This started 6 days ago. They did an xray at ED on 11/6 and they treated for cellulitis and is taking clinda. He does say that the warmth is coming down. Right knee.  He denies any popping or catching and has a scope in the past and reconstruction. He has a lot more swelling and color and is improving.   No diagnosis found.  Outpatient Encounter Medications as of 06/30/2020  Medication Sig  . clindamycin (CLEOCIN) 150 MG capsule Take 2 capsules (300 mg total) by mouth 3 (three) times daily for 7 days.  Marland Kitchen lisinopril (ZESTRIL) 10 MG tablet Take 1 tablet (10 mg total) by mouth daily.   No facility-administered encounter medications on file as of 06/30/2020.    Review of Systems  Constitutional: Negative for chills and fever.  Respiratory: Negative for shortness of breath and wheezing.   Cardiovascular: Negative for chest pain and leg swelling.  Musculoskeletal: Positive for arthralgias and joint swelling. Negative for back pain and gait problem.  Skin: Negative for color change, rash and wound.  All other systems reviewed and are  negative.   Observations/Objective: Patient sounds comfortable  Assessment and Plan: Problem List Items Addressed This Visit    None    Visit Diagnoses    Acute pain of right knee    -  Primary   Relevant Orders   Ambulatory referral to Orthopedic Surgery   Cellulitis of right upper extremity          Patient still has clindamycin through the next few days over the weekend and the redness and warmth is gone down and the swelling is starting to go down so recommend that he continue with, We will follow up from there. Follow up plan: Return if symptoms worsen or fail to improve.     I discussed the assessment and treatment plan with the patient. The patient was provided an opportunity to ask questions and all were answered. The patient agreed with the plan and demonstrated an understanding of the instructions.   The patient was advised to call back or seek an in-person evaluation if the symptoms worsen or if the condition fails to improve as anticipated.  The above assessment and management plan was discussed with the patient. The patient verbalized understanding of and has agreed to the management plan. Patient is aware to call the clinic if symptoms persist or worsen. Patient is aware when to return to the clinic for a follow-up visit. Patient educated on when it is appropriate to go to the emergency department.    I provided 18 minutes of non-face-to-face time during this encounter.    Nils Pyle, MD

## 2020-07-07 ENCOUNTER — Ambulatory Visit (INDEPENDENT_AMBULATORY_CARE_PROVIDER_SITE_OTHER): Payer: Managed Care, Other (non HMO) | Admitting: Nurse Practitioner

## 2020-07-07 ENCOUNTER — Other Ambulatory Visit: Payer: Self-pay

## 2020-07-07 ENCOUNTER — Encounter: Payer: Self-pay | Admitting: Nurse Practitioner

## 2020-07-07 VITALS — BP 158/93 | HR 89 | Temp 97.2°F | Resp 20 | Ht 70.0 in | Wt 302.0 lb

## 2020-07-07 DIAGNOSIS — I10 Essential (primary) hypertension: Secondary | ICD-10-CM | POA: Diagnosis not present

## 2020-07-07 MED ORDER — LOSARTAN POTASSIUM 25 MG PO TABS: 25.0000 mg | ORAL_TABLET | Freq: Every day | ORAL | 0 refills | Status: DC

## 2020-07-07 NOTE — Assessment & Plan Note (Addendum)
Hypertension not well controlled on current medication , patient is also experiencing a slight cough.  changes necessary to medication and dose, change lisinopril  10 mg to losartan 25 mg daily.  Follow up in 2 weeks, continue healthy low sodium diet and exercise, keep blood pressure log as often as you can and report or follow up with uncontrolled or worsening elevation of blood pressure values

## 2020-07-07 NOTE — Patient Instructions (Addendum)
Hypertension not well controlled on current medication , patient is also experiencing a slight cough.  changes necessary to medication and dose, change lisinopril  10 mg to losartan 25 mg daily.  Follow up in 2 weeks, keep a blood pressure log 1 week and call it into office.  Continue healthy low sodium diet and exercise, keep blood pressure log as often as you can and report or follow up with uncontrolled or worsening elevation of blood pressure values  Hypertension, Adult Hypertension is another name for high blood pressure. High blood pressure forces your heart to work harder to pump blood. This can cause problems over time. There are two numbers in a blood pressure reading. There is a top number (systolic) over a bottom number (diastolic). It is best to have a blood pressure that is below 120/80. Healthy choices can help lower your blood pressure, or you may need medicine to help lower it. What are the causes? The cause of this condition is not known. Some conditions may be related to high blood pressure. What increases the risk?  Smoking.  Having type 2 diabetes mellitus, high cholesterol, or both.  Not getting enough exercise or physical activity.  Being overweight.  Having too much fat, sugar, calories, or salt (sodium) in your diet.  Drinking too much alcohol.  Having long-term (chronic) kidney disease.  Having a family history of high blood pressure.  Age. Risk increases with age.  Race. You may be at higher risk if you are African American.  Gender. Men are at higher risk than women before age 84. After age 103, women are at higher risk than men.  Having obstructive sleep apnea.  Stress. What are the signs or symptoms?  High blood pressure may not cause symptoms. Very high blood pressure (hypertensive crisis) may cause: ? Headache. ? Feelings of worry or nervousness (anxiety). ? Shortness of breath. ? Nosebleed. ? A feeling of being sick to your stomach  (nausea). ? Throwing up (vomiting). ? Changes in how you see. ? Very bad chest pain. ? Seizures. How is this treated?  This condition is treated by making healthy lifestyle changes, such as: ? Eating healthy foods. ? Exercising more. ? Drinking less alcohol.  Your health care provider may prescribe medicine if lifestyle changes are not enough to get your blood pressure under control, and if: ? Your top number is above 130. ? Your bottom number is above 80.  Your personal target blood pressure may vary. Follow these instructions at home: Eating and drinking   If told, follow the DASH eating plan. To follow this plan: ? Fill one half of your plate at each meal with fruits and vegetables. ? Fill one fourth of your plate at each meal with whole grains. Whole grains include whole-wheat pasta, brown rice, and whole-grain bread. ? Eat or drink low-fat dairy products, such as skim milk or low-fat yogurt. ? Fill one fourth of your plate at each meal with low-fat (lean) proteins. Low-fat proteins include fish, chicken without skin, eggs, beans, and tofu. ? Avoid fatty meat, cured and processed meat, or chicken with skin. ? Avoid pre-made or processed food.  Eat less than 1,500 mg of salt each day.  Do not drink alcohol if: ? Your doctor tells you not to drink. ? You are pregnant, may be pregnant, or are planning to become pregnant.  If you drink alcohol: ? Limit how much you use to:  0-1 drink a day for women.  0-2 drinks a  day for men. ? Be aware of how much alcohol is in your drink. In the [place] one drink equals one 12 oz bottle of beer (355 mL), one 5 oz glass of wine (148 mL), or one 1 oz glass of hard liquor (44 mL). Lifestyle   Work with your doctor to stay at a healthy weight or to lose weight. Ask your doctor what the best weight is for you.  Get at least 30 minutes of exercise most days of the week. This may include walking, swimming, or biking.  Get at least 30  minutes of exercise that strengthens your muscles (resistance exercise) at least 3 days a week. This may include lifting weights or doing Pilates.  Do not use any products that contain nicotine or tobacco, such as cigarettes, e-cigarettes, and chewing tobacco. If you need help quitting, ask your doctor.  Check your blood pressure at home as told by your doctor.  Keep all follow-up visits as told by your doctor. This is important. Medicines  Take over-the-counter and prescription medicines only as told by your doctor. Follow directions carefully.  Do not skip doses of blood pressure medicine. The medicine does not work as well if you skip doses. Skipping doses also puts you at risk for problems.  Ask your doctor about side effects or reactions to medicines that you should watch for. Contact a doctor if you:  Think you are having a reaction to the medicine you are taking.  Have headaches that keep coming back (recurring).  Feel dizzy.  Have swelling in your ankles.  Have trouble with your vision. Get help right away if you:  Get a very bad headache.  Start to feel mixed up (confused).  Feel weak or numb.  Feel faint.  Have very bad pain in your: ? Chest. ? Belly (abdomen).  Throw up more than once.  Have trouble breathing. Summary  Hypertension is another name for high blood pressure.  High blood pressure forces your heart to work harder to pump blood.  For most people, a normal blood pressure is less than 120/80.  Making healthy choices can help lower blood pressure. If your blood pressure does not get lower with healthy choices, you may need to take medicine. This information is not intended to replace advice given to you by your health care provider. Make sure you discuss any questions you have with your health care provider. Document Revised: 04/16/2018 Document Reviewed: 04/16/2018 Elsevier Patient Education  2020 [employer].

## 2020-07-07 NOTE — Progress Notes (Signed)
Established Patient Office Visit  Subjective:  Patient ID: Christopher Fitzpatrick, male    DOB: 11-Apr-1966  Age: 54 y.o. MRN: [id number]  CC:  Chief Complaint  Patient presents with   Hypertension    6 week     HPI Christopher Fitzpatrick is a 54 year old patient who  presents for follow up of hypertension. Patient was diagnosed in 02/24/20. The patient is not  tolerating the medication well, side effects: cough and elevated blood pressure. Compliance with treatment has been good; including taking medication as directed , maintains a healthy diet and regular exercise regimen , and following up as directed. Current medication lisinopril 10 mg tablet daily. .  Past Medical History:  Diagnosis Date   Arthritis    "qwhere; knees, ankles, shoulders, hip" (12/03/2017)   Elevated blood pressure reading    History of gout    Left leg cellulitis 12/02/2017    Past Surgical History:  Procedure Laterality Date   CARPAL TUNNEL RELEASE Right 11/25/2015   Procedure: CARPAL TUNNEL RELEASE;  Surgeon: Jovita Gamma, MD;  Location: [facility];  Service: Neurosurgery;  Laterality: Right;   CARPAL TUNNEL RELEASE Left 12/23/2015   Procedure: CARPAL TUNNEL RELEASE;  Surgeon: Jovita Gamma, MD;  Location: [facility];  Service: Neurosurgery;  Laterality: Left;  Left Carpal Tunnel Release   INGUINAL HERNIA REPAIR Right 1986   KNEE ARTHROSCOPY Right 1987; 1988; 1989; 1990; ~ 1993"   KNEE ARTHROSCOPY Left ~ 2014   KNEE RECONSTRUCTION Right 1985    Family History  Problem Relation Age of Onset   Multiple myeloma Mother    Kidney disease Mother    Diabetes Mother    Hypertension Mother    Heart attack Father    Heart disease Father    Hypertension Father    Hyperlipidemia Father    Alcohol abuse Sister    Bipolar disorder Sister    Diabetes Sister    Hypertension Sister    Thyroid disease Sister    Heart murmur Daughter    Lung cancer Paternal Grandmother     Social History    Socioeconomic History   Marital status: Married    Spouse name: Not on file   Number of children: Not on file   Years of education: Not on file   Highest education level: Not on file  Occupational History   Occupation: Scientist, research (life sciences), owns vending company  Tobacco Use   Smoking status: Current Every Day Smoker    Years: 30.00    Types: Pipe    Last attempt to quit: 07/27/2015    Years since quitting: 4.9   Smokeless tobacco: Former Systems developer    Types: Snuff, Chew    Quit date: 1988  Vaping Use   Vaping Use: Never used  Substance and Sexual Activity   Alcohol use: Yes    Comment: 12/03/2017 "no more than 1 beer q couple months"   Drug use: No   Sexual activity: Yes    Birth control/protection: None  Other Topics Concern   Not on file  Social History Narrative   Not on file   Social Determinants of Health   Financial Resource Strain:    Difficulty of Paying Living Expenses: Not on file  Food Insecurity:    Worried About Charity fundraiser in the Last Year: Not on file   [employer] of Food in the Last Year: Not on file  Transportation Needs:    Lack of Transportation (Medical): Not  on file   Lack of Transportation (Non-Medical): Not on file  Physical Activity:    Days of Exercise per Week: Not on file   Minutes of Exercise per Session: Not on file  Stress:    Feeling of Stress : Not on file  Social Connections:    Frequency of Communication with Friends and Family: Not on file   Frequency of Social Gatherings with Friends and Family: Not on file   Attends Religious Services: Not on file   Active Member of Clubs or Organizations: Not on file   Attends Archivist Meetings: Not on file   Marital Status: Not on file  Intimate Partner Violence:    Fear of Current or Ex-Partner: Not on file   Emotionally Abused: Not on file   Physically Abused: Not on file   Sexually Abused: Not on file    Outpatient Medications Prior to Visit   Medication Sig Dispense Refill   lisinopril (ZESTRIL) 10 MG tablet Take 1 tablet (10 mg total) by mouth daily. 30 tablet 2   No facility-administered medications prior to visit.    No Known Allergies  ROS Review of Systems  Neurological: Negative for light-headedness, numbness and headaches.  Psychiatric/Behavioral: Negative for agitation. The patient is not nervous/anxious.   All other systems reviewed and are negative.     Objective:    Physical Exam Vitals reviewed.  Constitutional:      Appearance: Normal appearance.  HENT:     Head: Normocephalic.  Eyes:     Conjunctiva/sclera: Conjunctivae normal.  Cardiovascular:     Rate and Rhythm: Normal rate and regular rhythm.     Pulses: Normal pulses.     Heart sounds: Normal heart sounds.  Pulmonary:     Effort: Pulmonary effort is normal.     Breath sounds: Normal breath sounds.  Abdominal:     General: Bowel sounds are normal.  Skin:    General: Skin is warm.  Neurological:     Mental Status: He is alert and oriented to person, place, and time.  Psychiatric:        Mood and Affect: Mood normal.     BP (!) 158/93 (BP Location: Right Arm, Cuff Size: Large)    Pulse 89    Temp (!) 97.2 F (36.2 C)    Resp 20    Ht _0  (1.778 m)    Wt (!) 302 lb (137 kg)    SpO2 96%    BMI 43.33 kg/m  Wt Readings from Last 3 Encounters:  06/25/20 (!) 301 lb (136.5 kg)  05/26/20 (!) 301 lb (136.5 kg)  02/24/20 (!) 305 lb 9.6 oz (138.6 kg)     Health Maintenance Due  Topic Date Due   COVID-19 Vaccine (1) Never done    There are no preventive care reminders to display for this patient.  No results found for: TSH Lab Results  Component Value Date   WBC 10.2 03/28/2018   HGB 14.5 03/28/2018   HCT 42.5 03/28/2018   MCV 88.9 03/28/2018   PLT 196 03/28/2018   Lab Results  Component Value Date   NA 139 03/28/2018   K 3.7 03/28/2018   CO2 26 03/28/2018   GLUCOSE 141 (H) 03/28/2018   BUN 16 03/28/2018   CREATININE  1.06 03/28/2018   BILITOT 0.7 12/02/2017   ALKPHOS 62 12/02/2017   AST 30 12/02/2017   ALT 32 12/02/2017   PROT 7.2 12/02/2017   ALBUMIN 4.0 12/02/2017  CALCIUM 9.0 03/28/2018   ANIONGAP 9 03/28/2018      Assessment & Plan:   Essential hypertension Hypertension not well controlled on current medication , patient is also experiencing a slight cough.  changes necessary to medication and dose, change lisinopril  10 mg to losartan 25 mg daily.  Follow up in 2 weeks, continue healthy low sodium diet and exercise, keep blood pressure log as often as you can and report or follow up with uncontrolled or worsening elevation of blood pressure values   Meds ordered this encounter  Medications   losartan (COZAAR) 25 MG tablet    Sig: Take 1 tablet (25 mg total) by mouth daily.    Dispense:  60 tablet    Refill:  0    Follow-up: Return in about 3 months (around 10/07/2020).    Ivy Lynn, NP

## 2020-07-18 ENCOUNTER — Encounter: Payer: Self-pay | Admitting: Nurse Practitioner

## 2020-07-19 ENCOUNTER — Other Ambulatory Visit: Payer: Self-pay | Admitting: Nurse Practitioner

## 2020-07-19 MED ORDER — LOSARTAN POTASSIUM 50 MG PO TABS: 50.0000 mg | ORAL_TABLET | Freq: Every day | ORAL | 0 refills | Status: DC

## 2020-07-22 ENCOUNTER — Ambulatory Visit: Payer: Managed Care, Other (non HMO) | Admitting: Nurse Practitioner

## 2020-07-28 ENCOUNTER — Encounter: Payer: Self-pay | Admitting: Nurse Practitioner

## 2020-08-02 ENCOUNTER — Ambulatory Visit: Payer: Managed Care, Other (non HMO) | Admitting: Nurse Practitioner

## 2020-08-11 ENCOUNTER — Other Ambulatory Visit: Payer: Self-pay | Admitting: *Deleted

## 2020-08-11 MED ORDER — LOSARTAN POTASSIUM 50 MG PO TABS: 50.0000 mg | ORAL_TABLET | Freq: Every day | ORAL | 0 refills | Status: DC

## 2020-08-17 ENCOUNTER — Encounter: Payer: Self-pay | Admitting: Nurse Practitioner

## 2020-08-17 ENCOUNTER — Other Ambulatory Visit: Payer: Self-pay

## 2020-08-17 ENCOUNTER — Ambulatory Visit (INDEPENDENT_AMBULATORY_CARE_PROVIDER_SITE_OTHER): Payer: Managed Care, Other (non HMO) | Admitting: Nurse Practitioner

## 2020-08-17 VITALS — BP 152/85 | HR 78 | Temp 97.4°F | Ht 70.0 in | Wt 310.6 lb

## 2020-08-17 DIAGNOSIS — I10 Essential (primary) hypertension: Secondary | ICD-10-CM | POA: Diagnosis not present

## 2020-08-17 NOTE — Assessment & Plan Note (Signed)
Patient's essential hypertension not well controlled on current medication.  Cozaar 50 mg tablet 1 tablet by mouth daily.  Advised patient to discontinue naproxen for 1 week recheck blood pressure and follow-up with blood pressure log.  If not controlled plan is to increase patient's Cozaar to 75 mg tablet by mouth daily, and follow-up 2 weeks, if not controlled will increase Cozaar to 100 mg daily.  Patient is not reporting any signs and symptoms of hypertension.  Numbers are not grossly elevated just not within therapeutic values. Also educated patient on weight loss to assist with better blood pressure values. Continue low-sodium diet and exercise as tolerated.  Education provided with printed handouts given.  Patient verbalized understanding.  Follow-up with worsening or unresolved symptoms.

## 2020-08-17 NOTE — Progress Notes (Signed)
Established Patient Office Visit  Subjective:  Patient ID: Christopher Fitzpatrick, male    DOB: 1966/07/09  Age: 54 y.o. MRN: [id number]  CC:  Chief Complaint  Patient presents with  . Follow-up    HTN    HPI Christopher Fitzpatrick Pt presents for follow up of hypertension. Patient was diagnosed in 02/24/2020.. The patient is tolerating the medication well without side effects. Compliance with treatment has been good; including taking medication as directed , patient tries to maintain a healthy diet and regular exercise regimen , and following up as directed.  Current medication Cozaar 50 mg tablet by mouth daily.  Blood pressure slightly elevated.  Past Medical History:  Diagnosis Date  . Arthritis    "qwhere; knees, ankles, shoulders, hip" (12/03/2017)  . Elevated blood pressure reading   . History of gout   . Left leg cellulitis 12/02/2017    Past Surgical History:  Procedure Laterality Date  . CARPAL TUNNEL RELEASE Right 11/25/2015   Procedure: CARPAL TUNNEL RELEASE;  Surgeon: Jovita Gamma, MD;  Location: [facility];  Service: Neurosurgery;  Laterality: Right;  . CARPAL TUNNEL RELEASE Left 12/23/2015   Procedure: CARPAL TUNNEL RELEASE;  Surgeon: Jovita Gamma, MD;  Location: [facility];  Service: Neurosurgery;  Laterality: Left;  Left Carpal Tunnel Release  . INGUINAL HERNIA REPAIR Right 1986  . KNEE ARTHROSCOPY Right 1987; 1988; 1989; 1990; ~ 1993"  . KNEE ARTHROSCOPY Left ~ 2014  . KNEE RECONSTRUCTION Right 1985    Family History  Problem Relation Age of Onset  . Multiple myeloma Mother   . Kidney disease Mother   . Diabetes Mother   . Hypertension Mother   . Heart attack Father   . Heart disease Father   . Hypertension Father   . Hyperlipidemia Father   . Alcohol abuse Sister   . Bipolar disorder Sister   . Diabetes Sister   . Hypertension Sister   . Thyroid disease Sister   . Heart murmur Daughter   . Lung cancer Paternal Grandmother     Social History    Socioeconomic History  . Marital status: Married    Spouse name: Not on file  . Number of children: Not on file  . Years of education: Not on file  . Highest education level: Not on file  Occupational History  . Occupation: Scientist, research (life sciences), owns vending company  Tobacco Use  . Smoking status: Current Every Day Smoker    Years: 30.00    Types: Pipe    Last attempt to quit: 07/27/2015    Years since quitting: 5.0  . Smokeless tobacco: Former Systems developer    Types: Snuff, Sarina Ser    Quit date: 1988  Vaping Use  . Vaping Use: Never used  Substance and Sexual Activity  . Alcohol use: Yes    Comment: 12/03/2017 "no more than 1 beer q couple months"  . Drug use: No  . Sexual activity: Yes    Birth control/protection: None  Other Topics Concern  . Not on file  Social History Narrative  . Not on file   Social Determinants of Health   Financial Resource Strain: Not on file  Food Insecurity: Not on file  Transportation Needs: Not on file  Physical Activity: Not on file  Stress: Not on file  Social Connections: Not on file  Intimate Partner Violence: Not on file    Outpatient Medications Prior to Visit  Medication Sig Dispense Refill  . losartan (COZAAR) 50 MG tablet  Take 1 tablet (50 mg total) by mouth daily. 90 tablet 0  . naproxen (NAPROSYN) 500 MG tablet Take by mouth.     No facility-administered medications prior to visit.    No Known Allergies  ROS Review of Systems  Skin: Negative for color change.  Neurological: Negative for light-headedness, numbness and headaches.  All other systems reviewed and are negative.     Objective:    Physical Exam Vitals reviewed.  Constitutional:      Appearance: Normal appearance.  HENT:     Head: Normocephalic.     Nose: Nose normal.  Eyes:     Conjunctiva/sclera: Conjunctivae normal.  Cardiovascular:     Rate and Rhythm: Normal rate.     Pulses: Normal pulses.     Heart sounds: Normal heart sounds.  Pulmonary:     Effort:  Pulmonary effort is normal.     Breath sounds: Normal breath sounds.  Abdominal:     General: Bowel sounds are normal.  Musculoskeletal:     Cervical back: Normal range of motion.  Neurological:     Mental Status: He is alert and oriented to person, place, and time.  Psychiatric:        Mood and Affect: Mood normal.        Behavior: Behavior normal.     BP (!) 152/85   Pulse 78   Temp (!) 97.4 F (36.3 C)   Ht 5' 10"  (1.778 m)   Wt (!) 310 lb 9.6 oz (140.9 kg)   SpO2 96%   BMI 44.57 kg/m  Wt Readings from Last 3 Encounters:  08/17/20 (!) 310 lb 9.6 oz (140.9 kg)  07/07/20 (!) 302 lb (137 kg)  06/25/20 (!) 301 lb (136.5 kg)     There are no preventive care reminders to display for this patient.  There are no preventive care reminders to display for this patient.  No results found for: TSH Lab Results  Component Value Date   WBC 10.2 03/28/2018   HGB 14.5 03/28/2018   HCT 42.5 03/28/2018   MCV 88.9 03/28/2018   PLT 196 03/28/2018   Lab Results  Component Value Date   NA 139 03/28/2018   K 3.7 03/28/2018   CO2 26 03/28/2018   GLUCOSE 141 (H) 03/28/2018   BUN 16 03/28/2018   CREATININE 1.06 03/28/2018   BILITOT 0.7 12/02/2017   ALKPHOS 62 12/02/2017   AST 30 12/02/2017   ALT 32 12/02/2017   PROT 7.2 12/02/2017   ALBUMIN 4.0 12/02/2017   CALCIUM 9.0 03/28/2018   ANIONGAP 9 03/28/2018   No results found for: CHOL No results found for: HDL No results found for: LDLCALC No results found for: TRIG No results found for: CHOLHDL No results found for: HGBA1C    Assessment & Plan:   Problem List Items Addressed This Visit      Cardiovascular and Mediastinum   Essential hypertension - Primary    Patient's essential hypertension not well controlled on current medication.  Cozaar 50 mg tablet 1 tablet by mouth daily.  Advised patient to discontinue naproxen for 1 week recheck blood pressure and follow-up with blood pressure log.  If not controlled plan is to  increase patient's Cozaar to 75 mg tablet by mouth daily, and follow-up 2 weeks, if not controlled will increase Cozaar to 100 mg daily.  Patient is not reporting any signs and symptoms of hypertension.  Numbers are not grossly elevated just not within therapeutic values. Also educated patient on  weight loss to assist with better blood pressure values. Continue low-sodium diet and exercise as tolerated.  Education provided with printed handouts given.  Patient verbalized understanding.  Follow-up with worsening or unresolved symptoms.           Follow-up: Return in about 3 months (around 11/15/2020).    Ivy Lynn, NP

## 2020-08-17 NOTE — Patient Instructions (Addendum)
Patient will discontinue naproxen for 1 week, re-check blood pressures, if numbers consistently remains as is, will increase Cozaar to 75 mg tablet daily and follow-up with phone call after 2 weeks.  If blood pressure is still not controlled will increase Cozaar to 100 mg tablet daily and follow-up.   Hypertension, Adult Hypertension is another name for high blood pressure. High blood pressure forces your heart to work harder to pump blood. This can cause problems over time. There are two numbers in a blood pressure reading. There is a top number (systolic) over a bottom number (diastolic). It is best to have a blood pressure that is below 120/80. Healthy choices can help lower your blood pressure, or you may need medicine to help lower it. What are the causes? The cause of this condition is not known. Some conditions may be related to high blood pressure. What increases the risk?  Smoking.  Having type 2 diabetes mellitus, high cholesterol, or both.  Not getting enough exercise or physical activity.  Being overweight.  Having too much fat, sugar, calories, or salt (sodium) in your diet.  Drinking too much alcohol.  Having long-term (chronic) kidney disease.  Having a family history of high blood pressure.  Age. Risk increases with age.  Race. You may be at higher risk if you are African American.  Gender. Men are at higher risk than women before age 29. After age 13, women are at higher risk than men.  Having obstructive sleep apnea.  Stress. What are the signs or symptoms?  High blood pressure may not cause symptoms. Very high blood pressure (hypertensive crisis) may cause: ? Headache. ? Feelings of worry or nervousness (anxiety). ? Shortness of breath. ? Nosebleed. ? A feeling of being sick to your stomach (nausea). ? Throwing up (vomiting). ? Changes in how you see. ? Very bad chest pain. ? Seizures. How is this treated?  This condition is treated by making  healthy lifestyle changes, such as: ? Eating healthy foods. ? Exercising more. ? Drinking less alcohol.  Your health care provider may prescribe medicine if lifestyle changes are not enough to get your blood pressure under control, and if: ? Your top number is above 130. ? Your bottom number is above 80.  Your personal target blood pressure may vary. Follow these instructions at home: Eating and drinking   If told, follow the DASH eating plan. To follow this plan: ? Fill one half of your plate at each meal with fruits and vegetables. ? Fill one fourth of your plate at each meal with whole grains. Whole grains include whole-wheat pasta, brown rice, and whole-grain bread. ? Eat or drink low-fat dairy products, such as skim milk or low-fat yogurt. ? Fill one fourth of your plate at each meal with low-fat (lean) proteins. Low-fat proteins include fish, chicken without skin, eggs, beans, and tofu. ? Avoid fatty meat, cured and processed meat, or chicken with skin. ? Avoid pre-made or processed food.  Eat less than 1,500 mg of salt each day.  Do not drink alcohol if: ? Your doctor tells you not to drink. ? You are pregnant, may be pregnant, or are planning to become pregnant.  If you drink alcohol: ? Limit how much you use to:  0-1 drink a day for women.  0-2 drinks a day for men. ? Be aware of how much alcohol is in your drink. In the [place] one drink equals one 12 oz bottle of beer (355 mL), one 5  oz glass of wine (148 mL), or one 1 oz glass of hard liquor (44 mL). Lifestyle   Work with your doctor to stay at a healthy weight or to lose weight. Ask your doctor what the best weight is for you.  Get at least 30 minutes of exercise most days of the week. This may include walking, swimming, or biking.  Get at least 30 minutes of exercise that strengthens your muscles (resistance exercise) at least 3 days a week. This may include lifting weights or doing Pilates.  Do not use any  products that contain nicotine or tobacco, such as cigarettes, e-cigarettes, and chewing tobacco. If you need help quitting, ask your doctor.  Check your blood pressure at home as told by your doctor.  Keep all follow-up visits as told by your doctor. This is important. Medicines  Take over-the-counter and prescription medicines only as told by your doctor. Follow directions carefully.  Do not skip doses of blood pressure medicine. The medicine does not work as well if you skip doses. Skipping doses also puts you at risk for problems.  Ask your doctor about side effects or reactions to medicines that you should watch for. Contact a doctor if you:  Think you are having a reaction to the medicine you are taking.  Have headaches that keep coming back (recurring).  Feel dizzy.  Have swelling in your ankles.  Have trouble with your vision. Get help right away if you:  Get a very bad headache.  Start to feel mixed up (confused).  Feel weak or numb.  Feel faint.  Have very bad pain in your: ? Chest. ? Belly (abdomen).  Throw up more than once.  Have trouble breathing. Summary  Hypertension is another name for high blood pressure.  High blood pressure forces your heart to work harder to pump blood.  For most people, a normal blood pressure is less than 120/80.  Making healthy choices can help lower blood pressure. If your blood pressure does not get lower with healthy choices, you may need to take medicine. This information is not intended to replace advice given to you by your health care provider. Make sure you discuss any questions you have with your health care provider. Document Revised: 04/16/2018 Document Reviewed: 04/16/2018 Elsevier Patient Education  2020 [employer].

## 2020-09-20 ENCOUNTER — Ambulatory Visit: Payer: Managed Care, Other (non HMO) | Admitting: Orthopaedic Surgery

## 2020-10-07 DIAGNOSIS — M170 Bilateral primary osteoarthritis of knee: Secondary | ICD-10-CM | POA: Diagnosis not present

## 2020-10-11 ENCOUNTER — Encounter: Payer: Self-pay | Admitting: Family Medicine

## 2020-10-11 ENCOUNTER — Other Ambulatory Visit: Payer: Self-pay

## 2020-10-11 ENCOUNTER — Ambulatory Visit (INDEPENDENT_AMBULATORY_CARE_PROVIDER_SITE_OTHER): Payer: BC Managed Care – PPO | Admitting: Family Medicine

## 2020-10-11 VITALS — BP 154/89 | HR 78 | Temp 96.8°F | Ht 70.0 in | Wt 287.0 lb

## 2020-10-11 DIAGNOSIS — I10 Essential (primary) hypertension: Secondary | ICD-10-CM

## 2020-10-11 DIAGNOSIS — M1A0611 Idiopathic chronic gout, right knee, with tophus (tophi): Secondary | ICD-10-CM | POA: Insufficient documentation

## 2020-10-11 DIAGNOSIS — M1990 Unspecified osteoarthritis, unspecified site: Secondary | ICD-10-CM | POA: Diagnosis not present

## 2020-10-11 LAB — CMP14+EGFR
ALT: 19 IU/L (ref 0–44)
BUN: 28 mg/dL — ABNORMAL HIGH (ref 6–24)
Creatinine, Ser: 1.19 mg/dL (ref 0.76–1.27)

## 2020-10-11 LAB — CBC WITH DIFFERENTIAL/PLATELET
Immature Granulocytes: 0 %
Lymphocytes Absolute: 2 10*3/uL (ref 0.7–3.1)
MCHC: 33.4 g/dL (ref 31.5–35.7)
MCV: 87 fL (ref 79–97)
Platelets: 208 10*3/uL (ref 150–450)

## 2020-10-11 MED ORDER — LOSARTAN POTASSIUM 100 MG PO TABS: 100.0000 mg | ORAL_TABLET | Freq: Every day | ORAL | 1 refills | Status: DC

## 2020-10-11 NOTE — Progress Notes (Signed)
Assessment & Plan:  1. Essential hypertension Uncontrolled.  Increase losartan from 50 mg to 100 mg once daily.  Diet and exercise encouraged. - losartan (COZAAR) 100 MG tablet; Take 1 tablet (100 mg total) by mouth daily.  Dispense: 90 tablet; Refill: 1 - CMP14+EGFR - CBC with Differential/Platelet - Lipid panel  2. 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  Labs today to assess for improvement.  Discussed with patient that if his uric acid is still elevated his allopurinol will be titrated up to get this down. - Uric acid  3. Arthritis Advised patient he can use his naproxen.  He was previously told not to because it was increasing his blood pressure, but his blood pressure remains elevated off the medication.  We are working on better control of his blood pressure as well.  Continue following with the orthopedic.   Return in about 2 months (around 12/09/2020) for HTN, gout.  Hendricks Limes, MSN, APRN, FNP-C [facility]  Subjective:    Patient ID: Christopher Fitzpatrick, male    DOB: 1966/01/16, 55 y.o.   MRN: [id number]  Patient Care Team: Loman Brooklyn, FNP as PCP - General (Family Medicine)   Chief Complaint:  Chief Complaint  Patient presents with  . Hypertension    3 month follow up     HPI: Christopher Fitzpatrick is a 55 y.o. male presenting on 10/11/2020 for Hypertension (3 month follow up/)  Patient does not check his blood pressure at home very often.  He reports the few times that he has his systolic has been [id number].  He does eat a low-salt diet.  He is trying to exercise but is limited by his knees.  New complaints: Patient reports when he saw the orthopedic he was told the knot on the medial aspect of his right knee was gout.  He was started on allopurinol 100 mg once daily.  He denies any improvement in his symptoms.  He states he was told he needs bilateral knee replacements, but that he has  to lose weight first.  Social history:  Relevant past medical, surgical, family and social history reviewed and updated as indicated. Interim medical history since our last visit reviewed.  Allergies and medications reviewed and updated.  DATA REVIEWED: CHART IN EPIC  ROS: Negative unless specifically indicated above in HPI.    Current Outpatient Medications:  .  allopurinol (ZYLOPRIM) 100 MG tablet, Take 1 tablet by mouth daily., Disp: , Rfl:  .  losartan (COZAAR) 50 MG tablet, Take 1 tablet (50 mg total) by mouth daily., Disp: 90 tablet, Rfl: 0   No Known Allergies Past Medical History:  Diagnosis Date  . Arthritis    "qwhere; knees, ankles, shoulders, hip" (12/03/2017)  . Essential hypertension   . History of gout   . Left leg cellulitis 12/02/2017    Past Surgical History:  Procedure Laterality Date  . CARPAL TUNNEL RELEASE Right 11/25/2015   Procedure: CARPAL TUNNEL RELEASE;  Surgeon: Jovita Gamma, MD;  Location: [place] [facility];  Service: Neurosurgery;  Laterality: Right;  . CARPAL TUNNEL RELEASE Left 12/23/2015   Procedure: CARPAL TUNNEL RELEASE;  Surgeon: Jovita Gamma, MD;  Location: [place] [facility];  Service: Neurosurgery;  Laterality: Left;  Left Carpal Tunnel Release  . INGUINAL HERNIA REPAIR Right 1986  . KNEE ARTHROSCOPY Right 1987; 1988; 1989; 1990; ~ 1993"  . KNEE ARTHROSCOPY Left ~ 2014  . KNEE RECONSTRUCTION Right  1985    Social History   Socioeconomic History  . Marital status: Married    Spouse name: Not on file  . Number of children: Not on file  . Years of education: Not on file  . Highest education level: Not on file  Occupational History  . Occupation: Scientist, research (life sciences), owns vending company  Tobacco Use  . Smoking status: Current Every Day Smoker    Years: 30.00    Types: Pipe    Last attempt to quit: 07/27/2015    Years since quitting: 5.2  . Smokeless tobacco: Former Systems developer    Types: Snuff, Sarina Ser    Quit date: 1988  Vaping Use  . Vaping Use: Never  used  Substance and Sexual Activity  . Alcohol use: Yes    Comment: 12/03/2017 "no more than 1 beer q couple months"  . Drug use: No  . Sexual activity: Yes    Birth control/protection: None  Other Topics Concern  . Not on file  Social History Narrative  . Not on file   Social Determinants of Health   Financial Resource Strain: Not on file  Food Insecurity: Not on file  Transportation Needs: Not on file  Physical Activity: Not on file  Stress: Not on file  Social Connections: Not on file  Intimate Partner Violence: Not on file        Objective:    BP (!) 154/89   Pulse 78   Temp (!) 96.8 F (36 C) (Temporal)   Ht 5' 10"  (1.778 m)   Wt 287 lb (130.2 kg)   SpO2 96%   BMI 41.18 kg/m   Wt Readings from Last 3 Encounters:  10/11/20 287 lb (130.2 kg)  08/17/20 (!) 310 lb 9.6 oz (140.9 kg)  07/07/20 (!) 302 lb (137 kg)    Physical Exam Vitals reviewed.  Constitutional:      General: He is not in acute distress.    Appearance: Normal appearance. He is not ill-appearing, toxic-appearing or diaphoretic.  HENT:     Head: Normocephalic and atraumatic.  Eyes:     General: No scleral icterus.       Right eye: No discharge.        Left eye: No discharge.     Conjunctiva/sclera: Conjunctivae normal.  Cardiovascular:     Rate and Rhythm: Normal rate and regular rhythm.     Heart sounds: Normal heart sounds. No murmur heard. No friction rub. No gallop.   Pulmonary:     Effort: Pulmonary effort is normal. No respiratory distress.     Breath sounds: Normal breath sounds. No stridor. No wheezing, rhonchi or rales.  Musculoskeletal:        General: Normal range of motion.     Cervical back: Normal range of motion.     Right knee: Deformity (large soft tissue mass on medial aspect) present.  Skin:    General: Skin is warm and dry.  Neurological:     Mental Status: He is alert and oriented to person, place, and time. Mental status is at baseline.  Psychiatric:        Mood  and Affect: Mood normal.        Behavior: Behavior normal.        Thought Content: Thought content normal.        Judgment: Judgment normal.     No results found for: TSH Lab Results  Component Value Date   WBC 10.2 03/28/2018   HGB 14.5 03/28/2018  HCT 42.5 03/28/2018   MCV 88.9 03/28/2018   PLT 196 03/28/2018   Lab Results  Component Value Date   NA 139 03/28/2018   K 3.7 03/28/2018   CO2 26 03/28/2018   GLUCOSE 141 (H) 03/28/2018   BUN 16 03/28/2018   CREATININE 1.06 03/28/2018   BILITOT 0.7 12/02/2017   ALKPHOS 62 12/02/2017   AST 30 12/02/2017   ALT 32 12/02/2017   PROT 7.2 12/02/2017   ALBUMIN 4.0 12/02/2017   CALCIUM 9.0 03/28/2018   ANIONGAP 9 03/28/2018   No results found for: CHOL No results found for: HDL No results found for: LDLCALC No results found for: TRIG No results found for: CHOLHDL No results found for: HGBA1C

## 2020-10-11 NOTE — Patient Instructions (Signed)
Gout  Gout is painful swelling of your joints. Gout is a type of arthritis. It is caused by having too much uric acid in your body. Uric acid is a chemical that is made when your body breaks down substances called purines. If your body has too much uric acid, sharp crystals can form and build up in your joints. This causes pain and swelling. Gout attacks can happen quickly and be very painful (acute gout). Over time, the attacks can affect more joints and happen more often (chronic gout). What are the causes?  Too much uric acid in your blood. This can happen because: ? Your kidneys do not remove enough uric acid from your blood. ? Your body makes too much uric acid. ? You eat too many foods that are high in purines. These foods include organ meats, some seafood, and beer.  Trauma or stress. What increases the risk?  Having a family history of gout.  Being male and middle-aged.  Being male and having gone through menopause.  Being very overweight (obese).  Drinking alcohol, especially beer.  Not having enough water in the body (being dehydrated).  Losing weight too quickly.  Having an organ transplant.  Having lead poisoning.  Taking certain medicines.  Having kidney disease.  Having a skin condition called psoriasis. What are the signs or symptoms? An attack of acute gout usually happens in just one joint. The most common place is the big toe. Attacks often start at night. Other joints that may be affected include joints of the feet, ankle, knee, fingers, wrist, or elbow. Symptoms of an attack may include:  Very bad pain.  Warmth.  Swelling.  Stiffness.  Shiny, red, or purple skin.  Tenderness. The affected joint may be very painful to touch.  Chills and fever. Chronic gout may cause symptoms more often. More joints may be involved. You may also have white or yellow lumps (tophi) on your hands or feet or in other areas near your joints.   How is this  treated?  Treatment for this condition has two phases: treating an acute attack and preventing future attacks.  Acute gout treatment may include: ? NSAIDs. ? Steroids. These are taken by mouth or injected into a joint. ? Colchicine. This medicine relieves pain and swelling. It can be given by mouth or through an IV tube.  Preventive treatment may include: ? Taking small doses of NSAIDs or colchicine daily. ? Using a medicine that reduces uric acid levels in your blood. ? Making changes to your diet. You may need to see a food expert (dietitian) about what to eat and drink to prevent gout. Follow these instructions at home: During a gout attack  If told, put ice on the painful area: ? Put ice in a plastic bag. ? Place a towel between your skin and the bag. ? Leave the ice on for 20 minutes, 2-3 times a day.  Raise (elevate) the painful joint above the level of your heart as often as you can.  Rest the joint as much as possible. If the joint is in your leg, you may be given crutches.  Follow instructions from your doctor about what you cannot eat or drink.   Avoiding future gout attacks  Eat a low-purine diet. Avoid foods and drinks such as: ? Liver. ? Kidney. ? Anchovies. ? Asparagus. ? Herring. ? Mushrooms. ? Mussels. ? Beer.  Stay at a healthy weight. If you want to lose weight, talk with your doctor. Do   not lose weight too fast.  Start or continue an exercise plan as told by your doctor. Eating and drinking  Drink enough fluids to keep your pee (urine) pale yellow.  If you drink alcohol: ? Limit how much you use to:  0-1 drink a day for women.  0-2 drinks a day for men. ? Be aware of how much alcohol is in your drink. In the [place] one drink equals one 12 oz bottle of beer (355 mL), one 5 oz glass of wine (148 mL), or one 1 oz glass of hard liquor (44 mL). General instructions  Take over-the-counter and prescription medicines only as told by your doctor.  Do  not drive or use heavy machinery while taking prescription pain medicine.  Return to your normal activities as told by your doctor. Ask your doctor what activities are safe for you.  Keep all follow-up visits as told by your doctor. This is important. Contact a doctor if:  You have another gout attack.  You still have symptoms of a gout attack after 10 days of treatment.  You have problems (side effects) because of your medicines.  You have chills or a fever.  You have burning pain when you pee (urinate).  You have pain in your lower back or belly. Get help right away if:  You have very bad pain.  Your pain cannot be controlled.  You cannot pee. Summary  Gout is painful swelling of the joints.  The most common site of pain is the big toe, but it can affect other joints.  Medicines and avoiding some foods can help to prevent and treat gout attacks. This information is not intended to replace advice given to you by your health care provider. Make sure you discuss any questions you have with your health care provider. Document Revised: 02/26/2018 Document Reviewed: 02/26/2018 Elsevier Patient Education  2021 Else[employer] Inc[employer]  

## 2020-10-12 ENCOUNTER — Other Ambulatory Visit: Payer: Self-pay | Admitting: Family Medicine

## 2020-10-12 ENCOUNTER — Encounter: Payer: Self-pay | Admitting: Family Medicine

## 2020-10-12 LAB — CBC WITH DIFFERENTIAL/PLATELET
Basophils Absolute: 0.1 10*3/uL (ref 0.0–0.2)
Basos: 1 %
EOS (ABSOLUTE): 0.1 10*3/uL (ref 0.0–0.4)
Eos: 1 %
Hematocrit: 48.2 % (ref 37.5–51.0)
Hemoglobin: 16.1 g/dL (ref 13.0–17.7)
Immature Grans (Abs): 0 10*3/uL (ref 0.0–0.1)
Lymphs: 24 %
MCH: 29 pg (ref 26.6–33.0)
Monocytes Absolute: 0.6 10*3/uL (ref 0.1–0.9)
Monocytes: 7 %
Neutrophils Absolute: 5.5 10*3/uL (ref 1.4–7.0)
Neutrophils: 67 %
RBC: 5.56 x10E6/uL (ref 4.14–5.80)
RDW: 12.2 % (ref 11.6–15.4)
WBC: 8.3 10*3/uL (ref 3.4–10.8)

## 2020-10-12 LAB — CMP14+EGFR
AST: 12 IU/L (ref 0–40)
Albumin/Globulin Ratio: 2.4 — ABNORMAL HIGH (ref 1.2–2.2)
Albumin: 4.8 g/dL (ref 3.8–4.9)
Alkaline Phosphatase: 57 IU/L (ref 44–121)
BUN/Creatinine Ratio: 24 — ABNORMAL HIGH (ref 9–20)
Bilirubin Total: 0.8 mg/dL (ref 0.0–1.2)
CO2: 22 mmol/L (ref 20–29)
Calcium: 10 mg/dL (ref 8.7–10.2)
Chloride: 102 mmol/L (ref 96–106)
GFR calc Af Amer: 80 mL/min/{1.73_m2} (ref >59)
GFR calc non Af Amer: 69 mL/min/{1.73_m2} (ref >59)
Globulin, Total: 2 g/dL (ref 1.5–4.5)
Glucose: 84 mg/dL (ref 65–99)
Potassium: 4.6 mmol/L (ref 3.5–5.2)
Sodium: 140 mmol/L (ref 134–144)
Total Protein: 6.8 g/dL (ref 6.0–8.5)

## 2020-10-12 LAB — LIPID PANEL
Chol/HDL Ratio: 5.8 ratio — ABNORMAL HIGH (ref 0.0–5.0)
Cholesterol, Total: 221 mg/dL — ABNORMAL HIGH (ref 100–199)
HDL: 38 mg/dL — ABNORMAL LOW (ref >39)
LDL Chol Calc (NIH): 141 mg/dL — ABNORMAL HIGH (ref 0–99)
Triglycerides: 230 mg/dL — ABNORMAL HIGH (ref 0–149)
VLDL Cholesterol Cal: 42 mg/dL — ABNORMAL HIGH (ref 5–40)

## 2020-10-12 LAB — URIC ACID: Uric Acid: 6 mg/dL (ref 3.8–8.4)

## 2020-10-12 MED ORDER — ALLOPURINOL 100 MG PO TABS: 200.0000 mg | ORAL_TABLET | Freq: Every day | ORAL | 2 refills | Status: DC

## 2020-10-13 DIAGNOSIS — R2241 Localized swelling, mass and lump, right lower limb: Secondary | ICD-10-CM | POA: Diagnosis not present

## 2020-11-04 DIAGNOSIS — M1711 Unilateral primary osteoarthritis, right knee: Secondary | ICD-10-CM | POA: Diagnosis not present

## 2020-11-04 DIAGNOSIS — R2241 Localized swelling, mass and lump, right lower limb: Secondary | ICD-10-CM | POA: Diagnosis not present

## 2020-11-04 DIAGNOSIS — M19071 Primary osteoarthritis, right ankle and foot: Secondary | ICD-10-CM | POA: Diagnosis not present

## 2020-11-04 DIAGNOSIS — M25461 Effusion, right knee: Secondary | ICD-10-CM | POA: Diagnosis not present

## 2020-11-07 ENCOUNTER — Encounter: Payer: Self-pay | Admitting: *Deleted

## 2020-12-06 DIAGNOSIS — R2241 Localized swelling, mass and lump, right lower limb: Secondary | ICD-10-CM | POA: Diagnosis not present

## 2020-12-06 DIAGNOSIS — M25561 Pain in right knee: Secondary | ICD-10-CM | POA: Diagnosis not present

## 2020-12-06 DIAGNOSIS — M1711 Unilateral primary osteoarthritis, right knee: Secondary | ICD-10-CM | POA: Diagnosis not present

## 2020-12-09 ENCOUNTER — Ambulatory Visit: Payer: BC Managed Care – PPO | Admitting: Family Medicine

## 2021-01-17 ENCOUNTER — Ambulatory Visit (INDEPENDENT_AMBULATORY_CARE_PROVIDER_SITE_OTHER): Payer: BC Managed Care – PPO | Admitting: Family Medicine

## 2021-01-17 ENCOUNTER — Other Ambulatory Visit: Payer: Self-pay

## 2021-01-17 ENCOUNTER — Encounter: Payer: Self-pay | Admitting: Family Medicine

## 2021-01-17 VITALS — BP 138/82 | HR 79 | Temp 97.5°F | Ht 70.0 in | Wt 263.8 lb

## 2021-01-17 DIAGNOSIS — M1A0611 Idiopathic chronic gout, right knee, with tophus (tophi): Secondary | ICD-10-CM | POA: Diagnosis not present

## 2021-01-17 DIAGNOSIS — I10 Essential (primary) hypertension: Secondary | ICD-10-CM

## 2021-01-17 DIAGNOSIS — E782 Mixed hyperlipidemia: Secondary | ICD-10-CM

## 2021-01-17 DIAGNOSIS — Z01818 Encounter for other preprocedural examination: Secondary | ICD-10-CM

## 2021-01-17 MED ORDER — COLCHICINE 0.6 MG PO TABS: ORAL_TABLET | ORAL | 2 refills | Status: DC

## 2021-01-17 MED ORDER — ALLOPURINOL 300 MG PO TABS: 300.0000 mg | ORAL_TABLET | Freq: Every day | ORAL | 2 refills | Status: DC

## 2021-01-17 MED ORDER — LOSARTAN POTASSIUM 100 MG PO TABS: 100.0000 mg | ORAL_TABLET | Freq: Every day | ORAL | 1 refills | Status: DC

## 2021-01-17 NOTE — Patient Instructions (Signed)
High Cholesterol  High cholesterol is a condition in which the blood has high levels of a white, waxy substance similar to fat (cholesterol). The liver makes all the cholesterol that the body needs. The human body needs small amounts of cholesterol to help build cells. A person gets extra or excess cholesterol from the food that he or she eats. The blood carries cholesterol from the liver to the rest of the body. If you have high cholesterol, deposits (plaques) may build up on the walls of your arteries. Arteries are the blood vessels that carry blood away from your heart. These plaques make the arteries narrow and stiff. Cholesterol plaques increase your risk for heart attack and stroke. Work with your health care provider to keep your cholesterol levels in a healthy range. What increases the risk? The following factors may make you more likely to develop this condition:  Eating foods that are high in animal fat (saturated fat) or cholesterol.  Being overweight.  Not getting enough exercise.  A family history of high cholesterol (familial hypercholesterolemia).  Use of tobacco products.  Having diabetes. What are the signs or symptoms? There are no symptoms of this condition. How is this diagnosed? This condition may be diagnosed based on the results of a blood test.  If you are older than 55 years of age, your health care provider may check your cholesterol levels every 4-6 years.  You may be checked more often if you have high cholesterol or other risk factors for heart disease. The blood test for cholesterol measures:  "Bad" cholesterol, or LDL cholesterol. This is the main type of cholesterol that causes heart disease. The desired level is less than 100 mg/dL.  "Good" cholesterol, or HDL cholesterol. HDL helps protect against heart disease by cleaning the arteries and carrying the LDL to the liver for processing. The desired level for HDL is 60 mg/dL or higher.  Triglycerides.  These are fats that your body can store or burn for energy. The desired level is less than 150 mg/dL.  Total cholesterol. This measures the total amount of cholesterol in your blood and includes LDL, HDL, and triglycerides. The desired level is less than 200 mg/dL. How is this treated? This condition may be treated with:  Diet changes. You may be asked to eat foods that have more fiber and less saturated fats or added sugar.  Lifestyle changes. These may include regular exercise, maintaining a healthy weight, and quitting use of tobacco products.  Medicines. These are given when diet and lifestyle changes have not worked. You may be prescribed a statin medicine to help lower your cholesterol levels. Follow these instructions at home: Eating and drinking  Eat a healthy, balanced diet. This diet includes: ? Daily servings of a variety of fresh, frozen, or canned fruits and vegetables. ? Daily servings of whole grain foods that are rich in fiber. ? Foods that are low in saturated fats and trans fats. These include poultry and fish without skin, lean cuts of meat, and low-fat dairy products. ? A variety of fish, especially oily fish that contain omega-3 fatty acids. Aim to eat fish at least 2 times a week.  Avoid foods and drinks that have added sugar.  Use healthy cooking methods, such as roasting, grilling, broiling, baking, poaching, steaming, and stir-frying. Do not fry your food except for stir-frying.   Lifestyle  Get regular exercise. Aim to exercise for a total of 150 minutes a week. Increase your activity level by doing activities   such as gardening, walking, and taking the stairs.  Do not use any products that contain nicotine or tobacco, such as cigarettes, e-cigarettes, and chewing tobacco. If you need help quitting, ask your health care provider.   General instructions  Take over-the-counter and prescription medicines only as told by your health care provider.  Keep all  follow-up visits as told by your health care provider. This is important. Where to find more information  American Heart Association: www.heart.org  National Heart, Lung, and Blood Institute: www.nhlbi.nih.gov Contact a health care provider if:  You have trouble achieving or maintaining a healthy diet or weight.  You are starting an exercise program.  You are unable to stop smoking. Get help right away if:  You have chest pain.  You have trouble breathing.  You have any symptoms of a stroke. "BE FAST" is an easy way to remember the main warning signs of a stroke: ? B - Balance. Signs are dizziness, sudden trouble walking, or loss of balance. ? E - Eyes. Signs are trouble seeing or a sudden change in vision. ? F - Face. Signs are sudden weakness or numbness of the face, or the face or eyelid drooping on one side. ? A - Arms. Signs are weakness or numbness in an arm. This happens suddenly and usually on one side of the body. ? S - Speech. Signs are sudden trouble speaking, slurred speech, or trouble understanding what people say. ? T - Time. Time to call emergency services. Write down what time symptoms started.  You have other signs of a stroke, such as: ? A sudden, severe headache with no known cause. ? Nausea or vomiting. ? Seizure. These symptoms may represent a serious problem that is an emergency. Do not wait to see if the symptoms will go away. Get medical help right away. Call your local emergency services (911 in the [place] Do not drive yourself to the hospital. Summary  Cholesterol plaques increase your risk for heart attack and stroke. Work with your health care provider to keep your cholesterol levels in a healthy range.  Eat a healthy, balanced diet, get regular exercise, and maintain a healthy weight.  Do not use any products that contain nicotine or tobacco, such as cigarettes, e-cigarettes, and chewing tobacco.  Get help right away if you have any symptoms of a  stroke. This information is not intended to replace advice given to you by your health care provider. Make sure you discuss any questions you have with your health care provider. Document Revised: 07/06/2019 Document Reviewed: 07/06/2019 Elsevier Patient Education  2021 [employer] Inc[employer]  

## 2021-01-17 NOTE — Progress Notes (Signed)
Assessment & Plan:  1. Pre-operative clearance Cleared for surgery.  - CBC with Differential/Platelet; Future - CMP14+EGFR; Future  2. Essential hypertension Well controlled on current regimen.  - losartan (COZAAR) 100 MG tablet; Take 1 tablet (100 mg total) by mouth daily.  Dispense: 90 tablet; Refill: 1 - CBC with Differential/Platelet; Future - CMP14+EGFR; Future - Lipid panel; Future  3. Idiopathic chronic gout of right knee with tophus Uncontrolled. Allopurinol increased from 200 mg to 300 mg once daily. Colchicine prescribed to take when he has a flare.  - allopurinol (ZYLOPRIM) 300 MG tablet; Take 1 tablet (300 mg total) by mouth daily.  Dispense: 30 tablet; Refill: 2 - colchicine 0.6 MG tablet; Take 1.2 mg (2 tablets) by mouth at the first sign of a gout flare, then 0.6 mg (1 tablet) 1 hour later.  Dispense: 10 tablet; Refill: 2 - Uric acid; Future  4. Mixed hyperlipidemia Labs to assess. Education provided on high cholesterol.  - Lipid panel; Future   Return in about 4 months (around 05/19/2021) for annual physical.  Hendricks Limes, MSN, APRN, FNP-C [facility]  Subjective:    Patient ID: Christopher Fitzpatrick, male    DOB: 1966-03-15, 55 y.o.   MRN: [id number]  Patient Care Team: Loman Brooklyn, FNP as PCP - General (Family Medicine)   Chief Complaint:  Chief Complaint  Patient presents with   surgical clearance    Hypertension   Gout    2 month follow up    HPI: Christopher Fitzpatrick is a 55 y.o. male presenting on 01/17/2021 for surgical clearance , Hypertension, and Gout (2 month follow up)  Hypertension: Patient reports blood pressures at home around 130s/80s since increasing Losartan to 100 mg three months ago.  He does eat a low-salt diet.  He is trying to exercise but is limited by his knees.  Gout: patient reports multiple gout flares on Allopurinol 200 mg. He states he knows it occurs with foods that he eats. He does not have anything to  take when the flares come.  New complaints: Patient is requiring surgical clearance for a knee replacement.    Social history:  Relevant past medical, surgical, family and social history reviewed and updated as indicated. Interim medical history since our last visit reviewed.  Allergies and medications reviewed and updated.  DATA REVIEWED: CHART IN EPIC  ROS: Negative unless specifically indicated above in HPI.    Current Outpatient Medications:    allopurinol (ZYLOPRIM) 100 MG tablet, Take 2 tablets (200 mg total) by mouth daily., Disp: 60 tablet, Rfl: 2   losartan (COZAAR) 100 MG tablet, Take 1 tablet (100 mg total) by mouth daily., Disp: 90 tablet, Rfl: 1   No Known Allergies Past Medical History:  Diagnosis Date   Arthritis    "qwhere; knees, ankles, shoulders, hip" (12/03/2017)   Essential hypertension    Gout    Hyperlipidemia    Left leg cellulitis 12/02/2017    Past Surgical History:  Procedure Laterality Date   CARPAL TUNNEL RELEASE Right 11/25/2015   Procedure: CARPAL TUNNEL RELEASE;  Surgeon: Jovita Gamma, MD;  Location: [place] [facility];  Service: Neurosurgery;  Laterality: Right;   CARPAL TUNNEL RELEASE Left 12/23/2015   Procedure: CARPAL TUNNEL RELEASE;  Surgeon: Jovita Gamma, MD;  Location: [place] [facility];  Service: Neurosurgery;  Laterality: Left;  Left Carpal Tunnel Release   INGUINAL HERNIA REPAIR Right 1986   KNEE ARTHROSCOPY Right 1987; 1988; 1989; 1990; ~ 1993"  KNEE ARTHROSCOPY Left ~ 2014   KNEE RECONSTRUCTION Right 1985    Social History   Socioeconomic History   Marital status: Married    Spouse name: Not on file   Number of children: Not on file   Years of education: Not on file   Highest education level: Not on file  Occupational History   Occupation: Scientist, research (life sciences), owns vending company  Tobacco Use   Smoking status: Current Every Day Smoker    Years: 30.00    Types: Pipe    Last attempt to quit: 07/27/2015    Years since quitting: 5.4    Smokeless tobacco: Former Systems developer    Types: Snuff, Chew    Quit date: 1988  Vaping Use   Vaping Use: Never used  Substance and Sexual Activity   Alcohol use: Yes    Comment: 12/03/2017 "no more than 1 beer q couple months"   Drug use: No   Sexual activity: Yes    Birth control/protection: None  Other Topics Concern   Not on file  Social History Narrative   Not on file   Social Determinants of Health   Financial Resource Strain: Not on file  Food Insecurity: Not on file  Transportation Needs: Not on file  Physical Activity: Not on file  Stress: Not on file  Social Connections: Not on file  Intimate Partner Violence: Not on file        Objective:    BP 138/82   Pulse 79   Temp (!) 97.5 F (36.4 C) (Temporal)   Ht 5' 10"  (1.778 m)   Wt 263 lb 12.8 oz (119.7 kg)   SpO2 97%   BMI 37.85 kg/m   Wt Readings from Last 3 Encounters:  01/17/21 263 lb 12.8 oz (119.7 kg)  10/11/20 287 lb (130.2 kg)  08/17/20 (!) 310 lb 9.6 oz (140.9 kg)    Physical Exam Vitals reviewed.  Constitutional:      General: He is not in acute distress.    Appearance: Normal appearance. He is morbidly obese. He is not ill-appearing, toxic-appearing or diaphoretic.  HENT:     Head: Normocephalic and atraumatic.  Eyes:     General: No scleral icterus.       Right eye: No discharge.        Left eye: No discharge.     Conjunctiva/sclera: Conjunctivae normal.  Cardiovascular:     Rate and Rhythm: Normal rate and regular rhythm.     Heart sounds: Normal heart sounds. No murmur heard.   No friction rub. No gallop.  Pulmonary:     Effort: Pulmonary effort is normal. No respiratory distress.     Breath sounds: Normal breath sounds. No stridor. No wheezing, rhonchi or rales.  Musculoskeletal:        General: Normal range of motion.     Cervical back: Normal range of motion.     Right knee: Deformity (large gout area on medial aspect) present.  Skin:    General: Skin is warm and dry.  Neurological:      Mental Status: He is alert and oriented to person, place, and time. Mental status is at baseline.  Psychiatric:        Mood and Affect: Mood normal.        Behavior: Behavior normal.        Thought Content: Thought content normal.        Judgment: Judgment normal.    No results found for: TSH Lab Results  Component Value Date   WBC 8.3 10/11/2020   HGB 16.1 10/11/2020   HCT 48.2 10/11/2020   MCV 87 10/11/2020   PLT 208 10/11/2020   Lab Results  Component Value Date   NA 140 10/11/2020   K 4.6 10/11/2020   CO2 22 10/11/2020   GLUCOSE 84 10/11/2020   BUN 28 (H) 10/11/2020   CREATININE 1.19 10/11/2020   BILITOT 0.8 10/11/2020   ALKPHOS 57 10/11/2020   AST 12 10/11/2020   ALT 19 10/11/2020   PROT 6.8 10/11/2020   ALBUMIN 4.8 10/11/2020   CALCIUM 10.0 10/11/2020   ANIONGAP 9 03/28/2018   Lab Results  Component Value Date   CHOL 221 (H) 10/11/2020   Lab Results  Component Value Date   HDL 38 (L) 10/11/2020   Lab Results  Component Value Date   LDLCALC 141 (H) 10/11/2020   Lab Results  Component Value Date   TRIG 230 (H) 10/11/2020   Lab Results  Component Value Date   CHOLHDL 5.8 (H) 10/11/2020   No results found for: HGBA1C

## 2021-01-18 DIAGNOSIS — M1711 Unilateral primary osteoarthritis, right knee: Secondary | ICD-10-CM | POA: Diagnosis not present

## 2021-01-18 DIAGNOSIS — M1710 Unilateral primary osteoarthritis, unspecified knee: Secondary | ICD-10-CM | POA: Diagnosis not present

## 2021-01-18 DIAGNOSIS — Z01818 Encounter for other preprocedural examination: Secondary | ICD-10-CM | POA: Diagnosis not present

## 2021-02-06 DIAGNOSIS — Z01810 Encounter for preprocedural cardiovascular examination: Secondary | ICD-10-CM | POA: Diagnosis not present

## 2021-02-06 DIAGNOSIS — Z6841 Body Mass Index (BMI) 40.0 and over, adult: Secondary | ICD-10-CM | POA: Diagnosis not present

## 2021-02-06 DIAGNOSIS — E669 Obesity, unspecified: Secondary | ICD-10-CM | POA: Diagnosis not present

## 2021-02-06 DIAGNOSIS — M1711 Unilateral primary osteoarthritis, right knee: Secondary | ICD-10-CM | POA: Diagnosis not present

## 2021-02-06 DIAGNOSIS — Z01812 Encounter for preprocedural laboratory examination: Secondary | ICD-10-CM | POA: Diagnosis not present

## 2021-02-06 DIAGNOSIS — Z01818 Encounter for other preprocedural examination: Secondary | ICD-10-CM | POA: Diagnosis not present

## 2021-02-07 DIAGNOSIS — M1710 Unilateral primary osteoarthritis, unspecified knee: Secondary | ICD-10-CM | POA: Diagnosis not present

## 2021-02-13 DIAGNOSIS — M1711 Unilateral primary osteoarthritis, right knee: Secondary | ICD-10-CM | POA: Diagnosis not present

## 2021-02-13 DIAGNOSIS — Z9889 Other specified postprocedural states: Secondary | ICD-10-CM | POA: Diagnosis not present

## 2021-02-13 DIAGNOSIS — F17210 Nicotine dependence, cigarettes, uncomplicated: Secondary | ICD-10-CM | POA: Diagnosis not present

## 2021-02-13 DIAGNOSIS — T8484XA Pain due to internal orthopedic prosthetic devices, implants and grafts, initial encounter: Secondary | ICD-10-CM | POA: Diagnosis not present

## 2021-02-13 DIAGNOSIS — Z96651 Presence of right artificial knee joint: Secondary | ICD-10-CM | POA: Insufficient documentation

## 2021-02-13 DIAGNOSIS — M2341 Loose body in knee, right knee: Secondary | ICD-10-CM | POA: Diagnosis not present

## 2021-02-13 DIAGNOSIS — Z471 Aftercare following joint replacement surgery: Secondary | ICD-10-CM | POA: Diagnosis not present

## 2021-02-13 DIAGNOSIS — M67461 Ganglion, right knee: Secondary | ICD-10-CM | POA: Diagnosis not present

## 2021-02-13 DIAGNOSIS — M659 Synovitis and tenosynovitis, unspecified: Secondary | ICD-10-CM | POA: Diagnosis not present

## 2021-02-14 DIAGNOSIS — F17210 Nicotine dependence, cigarettes, uncomplicated: Secondary | ICD-10-CM | POA: Diagnosis not present

## 2021-02-14 DIAGNOSIS — M1710 Unilateral primary osteoarthritis, unspecified knee: Secondary | ICD-10-CM | POA: Diagnosis not present

## 2021-02-14 DIAGNOSIS — M1711 Unilateral primary osteoarthritis, right knee: Secondary | ICD-10-CM | POA: Diagnosis not present

## 2021-02-14 DIAGNOSIS — Z96651 Presence of right artificial knee joint: Secondary | ICD-10-CM | POA: Diagnosis not present

## 2021-02-16 ENCOUNTER — Ambulatory Visit: Payer: BC Managed Care – PPO | Attending: Sports Medicine | Admitting: Physical Therapy

## 2021-02-16 ENCOUNTER — Other Ambulatory Visit: Payer: Self-pay

## 2021-02-16 DIAGNOSIS — M6281 Muscle weakness (generalized): Secondary | ICD-10-CM | POA: Insufficient documentation

## 2021-02-16 DIAGNOSIS — R600 Localized edema: Secondary | ICD-10-CM | POA: Insufficient documentation

## 2021-02-16 DIAGNOSIS — M25561 Pain in right knee: Secondary | ICD-10-CM | POA: Diagnosis not present

## 2021-02-16 DIAGNOSIS — M25661 Stiffness of right knee, not elsewhere classified: Secondary | ICD-10-CM

## 2021-02-16 DIAGNOSIS — G8929 Other chronic pain: Secondary | ICD-10-CM | POA: Insufficient documentation

## 2021-02-16 NOTE — Therapy (Signed)
[facility] [address] [place], [place], [address] Phone: [phone]   Fax:  [phone]  Physical Therapy Evaluation  Patient Details  Name: TANISH PRIEN MRN: [id number] Date of Birth: Jul 09, 1966 Referring Provider (PT): Delena Serve MD   Encounter Date: 02/16/2021   PT End of Session - 02/16/21 1319     Visit Number 1    Number of Visits 12    Date for PT Re-Evaluation 03/30/21    Authorization Type FOTO AT LEAST EVERY 5TH VISIT.  PROGRESS NOTE AT 10TH VISIT.    PT Start Time [phone]    PT Stop Time 1033    PT Time Calculation (min) 46 min    Activity Tolerance Patient tolerated treatment well    Behavior During Therapy WFL for tasks assessed/performed             Past Medical History:  Diagnosis Date   Arthritis    "qwhere; knees, ankles, shoulders, hip" (12/03/2017)   Essential hypertension    Gout    Hyperlipidemia    Left leg cellulitis 12/02/2017    Past Surgical History:  Procedure Laterality Date   CARPAL TUNNEL RELEASE Right 11/25/2015   Procedure: CARPAL TUNNEL RELEASE;  Surgeon: Shirlean Kelly, MD;  Location: [facility];  Service: Neurosurgery;  Laterality: Right;   CARPAL TUNNEL RELEASE Left 12/23/2015   Procedure: CARPAL TUNNEL RELEASE;  Surgeon: Shirlean Kelly, MD;  Location: [facility];  Service: Neurosurgery;  Laterality: Left;  Left Carpal Tunnel Release   INGUINAL HERNIA REPAIR Right 1986   KNEE ARTHROSCOPY Right 1987; 1988; 1989; 1990; ~ 1993"   KNEE ARTHROSCOPY Left ~ 2014   KNEE RECONSTRUCTION Right 1985    There were no vitals filed for this visit.    Subjective Assessment - 02/16/21 1314     Subjective COVID-19 screen performed prior to patient entering clinic.  The patient presents to the clinic today s/p right total knee arthroplasty performed on 02/03/21.  She presented to the clinic today with an ACE wrap over his post-surgical dressing which he will be changing today.  He was  instructed in an HEP at the hospital and states he is compliant to performing these exercises.  He is walking with a FWW.    Pertinent History HTN, bilateral CTR, hernia repair, multiple prior bilateral knee surgeries.    How long can you walk comfortably? With walker in home.    Patient Stated Goals Get around without knee pain.    Currently in Pain? Yes    Pain Score 2     Pain Location Knee    Pain Orientation Right    Pain Descriptors / Indicators Sore    Pain Type Surgical pain    Pain Onset More than a month ago    Pain Frequency Constant    Aggravating Factors  Movement.    Pain Relieving Factors Rest.                [facility] PT Assessment - 02/16/21 0001       Assessment   Medical Diagnosis Right total knee arthroplasty.    Referring Provider (PT) Delena Serve MD    Onset Date/Surgical Date --   02/13/21 (surgery date).     Precautions   Precaution Comments No ultrasound.      Restrictions   Weight Bearing Restrictions No      Balance Screen   Has the patient fallen in the past 6 months No    Has  the patient had a decrease in activity level because of a fear of falling?  No    Is the patient reluctant to leave their home because of a fear of falling?  No      Home Environment   Living Environment Private residence      Prior Function   Level of Independence Independent      Observation/Other Assessments   Observations ACE inatc over post-surgical dressing.    Focus on Therapeutic Outcomes (FOTO)  Complete.      Observation/Other Assessments-Edema    Edema --   Observed moderate edema per contralateral comparison.     ROM / Strength   AROM / PROM / Strength AROM;Strength      AROM   Overall AROM Comments -10 degrees to 70 degrees.      Strength   Overall Strength Comments Patient unable to perform an antigravity right SLR at this time though he can hold his righ tLE against garvity when assisted into a SLR      Transfers   Comments Sit to stand with  armrests.  Transitions from table to seate dposition with patient using UE's to assist his right LE.      Ambulation/Gait   Gait Comments Safe ambulation with a FWW with normal cadence.                        Objective measurements completed on examination: See above findings.       [facility] Adult PT Treatment/Exercise - 02/16/21 0001       Exercises   Exercises Knee/Hip      Knee/Hip Exercises: Aerobic   Nustep Level 1 x 10 minutes moving seat forward x 2 to increase knee flexion.      Modalities   Modalities Vasopneumatic      Vasopneumatic   Number Minutes Vasopneumatic  10 minutes    Vasopnuematic Location  --   Right knee.   Vasopneumatic Pressure Low                         PT Long Term Goals - 02/16/21 1334       PT LONG TERM GOAL #1   Title Independent with a HEP.    Time 6    Period Weeks    Status New      PT LONG TERM GOAL #2   Title Full active right knee extension in order to normalize gait.    Time 6    Period Weeks    Status New      PT LONG TERM GOAL #3   Title Active knee flexion to 115 degrees+ so the patient can perform functional tasks and do so with pain not > 2-3/10.    Time 6    Period Weeks    Status New      PT LONG TERM GOAL #4   Title Increase right hip and knee strength to a solid 4+/5 to provide good stability for accomplishment of functional activities.    Time 6    Period Weeks    Status New      PT LONG TERM GOAL #5   Title Perform a reciprocating stair gait with one railing with pain not > 2-3/10.    Time 6    Period Weeks    Status New  Plan - 02/16/21 1327     Clinical Impression Statement The patient presents to [facility] s/p righ total knee replacement performed on 02/13/21.  He states he is compliant to his HEP.  He has an ACE wrap over his post-surgical dressinh which he is changing today.  He is walking safely with a FWW.  His current right knee AROM is -10 to 70  degrees.  He is unable to perform a SLR at this time.  Patient will benefit from skilled physical therapy intervention to address pain and deficits.    Personal Factors and Comorbidities Comorbidity 1;Comorbidity 2;Other    Comorbidities HTN, bilateral CTR, hernia repair, multiple prior bilateral knee surgeries.    Examination-Activity Limitations Locomotion Level;Transfers;Other    Examination-Participation Restrictions Other    Stability/Clinical Decision Making Stable/Uncomplicated    Clinical Decision Making Low    Rehab Potential Excellent    PT Frequency 2x / week    PT Duration 6 weeks    PT Treatment/Interventions ADLs/Self Care Home Management;Cryotherapy;Electrical Stimulation;Moist Heat;Gait training;Stair training;Functional mobility training;Therapeutic activities;Therapeutic exercise;Neuromuscular re-education;Manual techniques;Patient/family education;Passive range of motion;Vasopneumatic Device    PT Next Visit Plan Nustep, PROM, progress into TKA protocol.  Vasopnuematic.  VMS to left quadriceps as needed.    Consulted and Agree with Plan of Care Patient             Patient will benefit from skilled therapeutic intervention in order to improve the following deficits and impairments:  Impaired UE functional use, Pain, Decreased activity tolerance, Decreased range of motion, Decreased strength, Increased edema  Visit Diagnosis: Chronic pain of right knee - Plan: PT plan of care cert/re-cert  Muscle weakness (generalized) - Plan: PT plan of care cert/re-cert  Stiffness of right knee, not elsewhere classified - Plan: PT plan of care cert/re-cert  Localized edema - Plan: PT plan of care cert/re-cert     Problem List Patient Active Problem List   Diagnosis Date Noted   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 [place], [place], [address] Phone: [phone]   Fax:  [phone]  Name: QUINTAVIUS NIEBUHR MRN: [id number] Date of Birth: 04-Jan-1966

## 2021-02-22 ENCOUNTER — Encounter: Payer: Self-pay | Admitting: Physical Therapy

## 2021-02-22 ENCOUNTER — Other Ambulatory Visit: Payer: Self-pay

## 2021-02-22 ENCOUNTER — Ambulatory Visit: Payer: BC Managed Care – PPO | Attending: Sports Medicine | Admitting: Physical Therapy

## 2021-02-22 DIAGNOSIS — M25561 Pain in right knee: Secondary | ICD-10-CM | POA: Insufficient documentation

## 2021-02-22 DIAGNOSIS — R600 Localized edema: Secondary | ICD-10-CM | POA: Diagnosis not present

## 2021-02-22 DIAGNOSIS — G8929 Other chronic pain: Secondary | ICD-10-CM | POA: Diagnosis not present

## 2021-02-22 DIAGNOSIS — M6281 Muscle weakness (generalized): Secondary | ICD-10-CM | POA: Insufficient documentation

## 2021-02-22 DIAGNOSIS — M25661 Stiffness of right knee, not elsewhere classified: Secondary | ICD-10-CM | POA: Insufficient documentation

## 2021-02-22 NOTE — Therapy (Signed)
[facility] [address] [place], [place], [address] Phone: [phone]   Fax:  [phone]  Physical Therapy Treatment  Patient Details  Name: Christopher Fitzpatrick MRN: [id number] Date of Birth: 01-Jun-1966 Referring Provider (PT): Delena Serve MD   Encounter Date: 02/22/2021   PT End of Session - 02/22/21 1791     Visit Number 2    Number of Visits 12    Date for PT Re-Evaluation 03/30/21    Authorization Type FOTO AT LEAST EVERY 5TH VISIT.  PROGRESS NOTE AT 10TH VISIT.    PT Start Time 0818    PT Stop Time 0907    PT Time Calculation (min) 49 min    Activity Tolerance Patient tolerated treatment well    Behavior During Therapy WFL for tasks assessed/performed             Past Medical History:  Diagnosis Date   Arthritis    "qwhere; knees, ankles, shoulders, hip" (12/03/2017)   Essential hypertension    Gout    Hyperlipidemia    Left leg cellulitis 12/02/2017    Past Surgical History:  Procedure Laterality Date   CARPAL TUNNEL RELEASE Right 11/25/2015   Procedure: CARPAL TUNNEL RELEASE;  Surgeon: Shirlean Kelly, MD;  Location: [facility];  Service: Neurosurgery;  Laterality: Right;   CARPAL TUNNEL RELEASE Left 12/23/2015   Procedure: CARPAL TUNNEL RELEASE;  Surgeon: Shirlean Kelly, MD;  Location: [facility];  Service: Neurosurgery;  Laterality: Left;  Left Carpal Tunnel Release   INGUINAL HERNIA REPAIR Right 1986   KNEE ARTHROSCOPY Right 1987; 1988; 1989; 1990; ~ 1993"   KNEE ARTHROSCOPY Left ~ 2014   KNEE RECONSTRUCTION Right 1985    There were no vitals filed for this visit.   Subjective Assessment - 02/22/21 0818     Subjective COVID-19 screen performed prior to patient entering clinic. Patient no longer using AD but denies pain.    Patient is accompained by: Family member   Daughter, [name]   Pertinent History HTN, bilateral CTR, hernia repair, multiple prior bilateral knee surgeries.    How long can you walk  comfortably? With walker in home.    Patient Stated Goals Get around without knee pain.    Currently in Pain? No/denies                [facility] PT Assessment - 02/22/21 0001       Assessment   Medical Diagnosis Right total knee arthroplasty.    Referring Provider (PT) Delena Serve MD    Onset Date/Surgical Date 02/13/21    Next MD Visit 02/27/2021      Precautions   Precaution Comments No ultrasound.      Restrictions   Weight Bearing Restrictions No      Observation/Other Assessments-Edema    Edema Circumferential      Circumferential Edema   Circumferential - Right R distal tib/fib 30 cm    Circumferential - Left  L distal tib/fib 28.3 cm      ROM / Strength   AROM / PROM / Strength AROM      AROM   Overall AROM  Deficits    AROM Assessment Site Knee    Right/Left Knee Right    Right Knee Extension 6    Right Knee Flexion 97                           [facility] Adult PT Treatment/Exercise - 02/22/21  0001       Knee/Hip Exercises: Aerobic   Nustep L3, sesat 12-10 x15 min      Knee/Hip Exercises: Standing   Forward Lunges Right;15 reps;5 seconds;Limitations    Forward Lunges Limitations 6" step    Rocker Board 3 minutes      Knee/Hip Exercises: Supine   Quad Sets Right;Limitations    Quad Sets Limitations Sym. biphasic VMS for NMR of R quad      Modalities   Modalities Vasopneumatic;Electrical Stimulation      Electrical Stimulation   Electrical Stimulation Location R quad    Electrical Stimulation Action Symmetrical biphasic VMS    Electrical Stimulation Parameters 10/10, 300 usec, 35 hz x10 min with QS    Electrical Stimulation Goals Neuromuscular facilitation      Vasopneumatic   Number Minutes Vasopneumatic  10 minutes    Vasopnuematic Location  Knee    Vasopneumatic Pressure Low    Vasopneumatic Temperature  34/edema                         PT Long Term Goals - 02/16/21 1334       PT LONG TERM GOAL #1   Title  Independent with a HEP.    Time 6    Period Weeks    Status New      PT LONG TERM GOAL #2   Title Full active right knee extension in order to normalize gait.    Time 6    Period Weeks    Status New      PT LONG TERM GOAL #3   Title Active knee flexion to 115 degrees+ so the patient can perform functional tasks and do so with pain not > 2-3/10.    Time 6    Period Weeks    Status New      PT LONG TERM GOAL #4   Title Increase right hip and knee strength to a solid 4+/5 to provide good stability for accomplishment of functional activities.    Time 6    Period Weeks    Status New      PT LONG TERM GOAL #5   Title Perform a reciprocating stair gait with one railing with pain not > 2-3/10.    Time 6    Period Weeks    Status New                   Plan - 02/22/21 0912     Clinical Impression Statement Patient presented in clinic with denial of any R knee pain. Patient had no AD for ambulation but two large areas of swelling in the medial tibial plateau region (12 cm x 11cm) and superiolateral knee (8 cm x 12 cm). Patient's postsurgical aquacell in place and patient encouraged to continue medicating as instructed and icing with elevation. More edema in R calf and knee as patient did not have TED hose donned and has not as he states that it is very difficult for his wife to don them. The area over the medial tibial plateau region is from cyst removal per patient report. Patient able to tolerate light stretching well although VCs provided to avoid pushing through pain. VMS of R quad initiated today as patient demonstrates deficient QS and requires assist for lifting RUE to plinth. AROM of R knee measured as 6-97 deg which he states is what he had prior to TKR and after 5 other knee reconstructive surgeries. Normal  modalities response noted following removal of the modalities.    Personal Factors and Comorbidities Comorbidity 1;Comorbidity 2;Other    Comorbidities HTN, bilateral  CTR, hernia repair, multiple prior bilateral knee surgeries.    Examination-Activity Limitations Locomotion Level;Transfers;Other    Examination-Participation Restrictions Other    Stability/Clinical Decision Making Stable/Uncomplicated    Rehab Potential Excellent    PT Frequency 2x / week    PT Duration 6 weeks    PT Treatment/Interventions ADLs/Self Care Home Management;Cryotherapy;Electrical Stimulation;Moist Heat;Gait training;Stair training;Functional mobility training;Therapeutic activities;Therapeutic exercise;Neuromuscular re-education;Manual techniques;Patient/family education;Passive range of motion;Vasopneumatic Device    PT Next Visit Plan Nustep, PROM, progress into TKA protocol.  Vasopnuematic.  VMS to left quadriceps as needed.    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 [place], [place], [address] Phone: [phone]   Fax:  [phone]  Name: Christopher Fitzpatrick MRN: [id number] Date of Birth: October 02, 1965

## 2021-02-24 ENCOUNTER — Ambulatory Visit: Payer: BC Managed Care – PPO | Admitting: Physical Therapy

## 2021-02-24 ENCOUNTER — Other Ambulatory Visit: Payer: Self-pay

## 2021-02-24 ENCOUNTER — Encounter: Payer: Self-pay | Admitting: Physical Therapy

## 2021-02-24 DIAGNOSIS — M6281 Muscle weakness (generalized): Secondary | ICD-10-CM | POA: Diagnosis not present

## 2021-02-24 DIAGNOSIS — R600 Localized edema: Secondary | ICD-10-CM | POA: Diagnosis not present

## 2021-02-24 DIAGNOSIS — M25561 Pain in right knee: Secondary | ICD-10-CM

## 2021-02-24 DIAGNOSIS — G8929 Other chronic pain: Secondary | ICD-10-CM

## 2021-02-24 DIAGNOSIS — M25661 Stiffness of right knee, not elsewhere classified: Secondary | ICD-10-CM | POA: Diagnosis not present

## 2021-02-24 NOTE — Therapy (Signed)
[facility] [address] [place], [place], [address] Phone: [phone]   Fax:  [phone]  Physical Therapy Treatment  Patient Details  Name: Christopher Fitzpatrick MRN: [id number] Date of Birth: [birth year] Referring Provider (PT): Delena Serve MD   Encounter Date: 02/24/2021   PT End of Session - 02/24/21 0824     Visit Number 3    Number of Visits 12    Date for PT Re-Evaluation 03/30/21    Authorization Type FOTO AT LEAST EVERY 5TH VISIT.  PROGRESS NOTE AT 10TH VISIT.    PT Start Time 0817    PT Stop Time 0906    PT Time Calculation (min) 49 min    Activity Tolerance Patient tolerated treatment well    Behavior During Therapy WFL for tasks assessed/performed             Past Medical History:  Diagnosis Date   Arthritis    "qwhere; knees, ankles, shoulders, hip" (12/03/2017)   Essential hypertension    Gout    Hyperlipidemia    Left leg cellulitis 12/02/2017    Past Surgical History:  Procedure Laterality Date   CARPAL TUNNEL RELEASE Right 11/25/2015   Procedure: CARPAL TUNNEL RELEASE;  Surgeon: Shirlean Kelly, MD;  Location: [facility];  Service: Neurosurgery;  Laterality: Right;   CARPAL TUNNEL RELEASE Left 12/23/2015   Procedure: CARPAL TUNNEL RELEASE;  Surgeon: Shirlean Kelly, MD;  Location: [facility];  Service: Neurosurgery;  Laterality: Left;  Left Carpal Tunnel Release   INGUINAL HERNIA REPAIR Right 1986   KNEE ARTHROSCOPY Right 1987; 1988; 1989; 1990; ~ 1993"   KNEE ARTHROSCOPY Left ~ 2014   KNEE RECONSTRUCTION Right 1985    There were no vitals filed for this visit.   Subjective Assessment - 02/24/21 0818     Subjective COVID-19 screen performed prior to patient entering clinic. Patient reports that he has wore compression sock the past two days. Felt like he had some improved mobility after VMS last session.    Pertinent History HTN, bilateral CTR, hernia repair, multiple prior bilateral knee surgeries.     How long can you walk comfortably? With walker in home.    Patient Stated Goals Get around without knee pain.    Currently in Pain? No/denies                [facility] PT Assessment - 02/24/21 0001       Assessment   Medical Diagnosis Right total knee arthroplasty.    Referring Provider (PT) Delena Serve MD    Onset Date/Surgical Date 02/13/21    Next MD Visit 02/27/2021      Precautions   Precaution Comments No ultrasound.      Observation/Other Assessments-Edema    Edema Circumferential   medial knee 13x12 cm; lateral 12x12 cm                          [facility] Adult PT Treatment/Exercise - 02/24/21 0001       Knee/Hip Exercises: Aerobic   Nustep L3, sesat 10-9 x15 min      Knee/Hip Exercises: Standing   Forward Lunges Right;15 reps;5 seconds;Limitations    Forward Lunges Limitations 6" step    Rocker Board 4 minutes      Knee/Hip Exercises: Supine   Short Arc Quad Sets Right;Limitations    Short Arc Quad Sets Limitations with VMS      Modalities   Modalities Vasopneumatic;Electrical Stimulation  Sales executive VMS with SAQ    Electrical Stimulation Parameters 10/10, 300 usec, 50 bps x10 min    Electrical Stimulation Goals Neuromuscular facilitation      Vasopneumatic   Number Minutes Vasopneumatic  10 minutes    Vasopnuematic Location  Knee    Vasopneumatic Pressure Low    Vasopneumatic Temperature  34/edema                         PT Long Term Goals - 02/16/21 1334       PT LONG TERM GOAL #1   Title Independent with a HEP.    Time 6    Period Weeks    Status New      PT LONG TERM GOAL #2   Title Full active right knee extension in order to normalize gait.    Time 6    Period Weeks    Status New      PT LONG TERM GOAL #3   Title Active knee flexion to 115 degrees+ so the patient can perform functional tasks and do so with pain not >  2-3/10.    Time 6    Period Weeks    Status New      PT LONG TERM GOAL #4   Title Increase right hip and knee strength to a solid 4+/5 to provide good stability for accomplishment of functional activities.    Time 6    Period Weeks    Status New      PT LONG TERM GOAL #5   Title Perform a reciprocating stair gait with one railing with pain not > 2-3/10.    Time 6    Period Weeks    Status New                   Plan - 02/24/21 0931     Clinical Impression Statement Patient presented in clinic with continued excessive edema of R knee but compression sock was applied. Compression sock stops just below R knee and the two pockets of increased edema were measured as results are within the note. Patient able to tolerate lower level stretching and quad retraining via VMS continued as well. Medial pocket of edema palpated along medial HS as well. Normal modalities response noted following removal of the modalities.    Personal Factors and Comorbidities Comorbidity 1;Comorbidity 2;Other    Comorbidities HTN, bilateral CTR, hernia repair, multiple prior bilateral knee surgeries.    Examination-Activity Limitations Locomotion Level;Transfers;Other    Examination-Participation Restrictions Other    Stability/Clinical Decision Making Stable/Uncomplicated    Rehab Potential Excellent    PT Frequency 2x / week    PT Duration 6 weeks    PT Treatment/Interventions ADLs/Self Care Home Management;Cryotherapy;Electrical Stimulation;Moist Heat;Gait training;Stair training;Functional mobility training;Therapeutic activities;Therapeutic exercise;Neuromuscular re-education;Manual techniques;Patient/family education;Passive range of motion;Vasopneumatic Device    PT Next Visit Plan Nustep, PROM, progress into TKA protocol.  Vasopnuematic.  VMS to left quadriceps as needed.    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 [place], [place], [address] Phone: [phone]   Fax:  [phone]  Name: KIEV LABROSSE MRN: [id number] Date of Birth: 08-02-1966

## 2021-02-27 ENCOUNTER — Other Ambulatory Visit: Payer: Self-pay

## 2021-02-27 ENCOUNTER — Encounter: Payer: Self-pay | Admitting: Physical Therapy

## 2021-02-27 ENCOUNTER — Ambulatory Visit: Payer: BC Managed Care – PPO | Admitting: Physical Therapy

## 2021-02-27 DIAGNOSIS — G8929 Other chronic pain: Secondary | ICD-10-CM | POA: Diagnosis not present

## 2021-02-27 DIAGNOSIS — M25561 Pain in right knee: Secondary | ICD-10-CM

## 2021-02-27 DIAGNOSIS — R600 Localized edema: Secondary | ICD-10-CM | POA: Diagnosis not present

## 2021-02-27 DIAGNOSIS — M6281 Muscle weakness (generalized): Secondary | ICD-10-CM | POA: Diagnosis not present

## 2021-02-27 DIAGNOSIS — M25661 Stiffness of right knee, not elsewhere classified: Secondary | ICD-10-CM | POA: Diagnosis not present

## 2021-02-27 NOTE — Therapy (Signed)
[facility] [address] [place], [place], [address] Phone: [phone]   Fax:  [phone]  Physical Therapy Treatment  Patient Details  Name: Christopher Fitzpatrick MRN: [id number] Date of Birth: [birth year] Referring Provider (PT): Delena Serve MD   Encounter Date: 02/27/2021   PT End of Session - 02/27/21 0816     Visit Number 4    Number of Visits 12    Date for PT Re-Evaluation 03/30/21    Authorization Type FOTO AT LEAST EVERY 5TH VISIT.  PROGRESS NOTE AT 10TH VISIT.    PT Start Time 0814    PT Stop Time 0906    PT Time Calculation (min) 52 min    Activity Tolerance Patient tolerated treatment well    Behavior During Therapy WFL for tasks assessed/performed             Past Medical History:  Diagnosis Date   Arthritis    "qwhere; knees, ankles, shoulders, hip" (12/03/2017)   Essential hypertension    Gout    Hyperlipidemia    Left leg cellulitis 12/02/2017    Past Surgical History:  Procedure Laterality Date   CARPAL TUNNEL RELEASE Right 11/25/2015   Procedure: CARPAL TUNNEL RELEASE;  Surgeon: Shirlean Kelly, MD;  Location: [facility];  Service: Neurosurgery;  Laterality: Right;   CARPAL TUNNEL RELEASE Left 12/23/2015   Procedure: CARPAL TUNNEL RELEASE;  Surgeon: Shirlean Kelly, MD;  Location: [facility];  Service: Neurosurgery;  Laterality: Left;  Left Carpal Tunnel Release   INGUINAL HERNIA REPAIR Right 1986   KNEE ARTHROSCOPY Right 1987; 1988; 1989; 1990; ~ 1993"   KNEE ARTHROSCOPY Left ~ 2014   KNEE RECONSTRUCTION Right 1985    There were no vitals filed for this visit.   Subjective Assessment - 02/27/21 0816     Subjective COVID-19 screen performed prior to patient entering clinic. No new complaints.    Patient is accompained by: Family member   Daughter   Pertinent History HTN, bilateral CTR, hernia repair, multiple prior bilateral knee surgeries.    How long can you walk comfortably? With walker in home.     Patient Stated Goals Get around without knee pain.    Currently in Pain? No/denies                [facility] PT Assessment - 02/27/21 0001       Assessment   Medical Diagnosis Right total knee arthroplasty.    Referring Provider (PT) Delena Serve MD    Onset Date/Surgical Date 02/13/21    Next MD Visit 02/27/2021      Precautions   Precaution Comments No ultrasound.      Restrictions   Weight Bearing Restrictions No      ROM / Strength   AROM / PROM / Strength AROM      AROM   Overall AROM  Deficits    AROM Assessment Site Knee    Right/Left Knee Right    Right Knee Extension 7    Right Knee Flexion 114                           [facility] Adult PT Treatment/Exercise - 02/27/21 0001       Knee/Hip Exercises: Aerobic   Recumbent Bike seat 10 x5 min; full forward revolutions    Nustep L3, sesat 11-9 x10 min      Knee/Hip Exercises: Standing   Hip Flexion AROM;Right;2 sets;10 reps;Knee straight  Forward Lunges Right;15 reps;5 seconds;Limitations    Forward Lunges Limitations 6" step    Step Down Right;15 reps;Hand Hold: 2;Step Height: 4"    Rocker Board 3 minutes      Modalities   Modalities Vasopneumatic;Electrical Stimulation      Programme researcher, broadcasting/film/video Location R quad    Electrical Stimulation Action VMS with SAQ    Electrical Stimulation Parameters 10/10 x10 min    Electrical Stimulation Goals Neuromuscular facilitation      Vasopneumatic   Number Minutes Vasopneumatic  10 minutes    Vasopnuematic Location  Knee    Vasopneumatic Pressure Low    Vasopneumatic Temperature  34/edema                         PT Long Term Goals - 02/27/21 7829       PT LONG TERM GOAL #1   Title Independent with a HEP.    Time 6    Period Weeks    Status On-going      PT LONG TERM GOAL #2   Title Full active right knee extension in order to normalize gait.    Time 6    Period Weeks    Status On-going       PT LONG TERM GOAL #3   Title Active knee flexion to 115 degrees+ so the patient can perform functional tasks and do so with pain not > 2-3/10.    Time 6    Period Weeks    Status Achieved      PT LONG TERM GOAL #4   Title Increase right hip and knee strength to a solid 4+/5 to provide good stability for accomplishment of functional activities.    Time 6    Period Weeks    Status On-going      PT LONG TERM GOAL #5   Title Perform a reciprocating stair gait with one railing with pain not > 2-3/10.    Time 6    Period Weeks    Status On-going                   Plan - 02/27/21 0849     Clinical Impression Statement Patient presented in clinic with reports of trying to maintain ice and elevation over the weekend. Patient reports that his wife is keeping ice in his machine for hours but was instructed for once an hour at max of 20 minutes. Continued edema notable in medial and lateral R knee. Compression sock donned to RLE and stops just short of inferior edge of post surgical dressing. Patient's AROM of R knee measured as 7-114 deg today. Patient did report intermittant sharp R posterior knee discomfort with AROM QS. More active quad activation exercises initiated today followed by VMS to R quad. Quicker twitch response to symmetrical biphasic VMS. Normal modalities response noted following removal of the modalities.    Personal Factors and Comorbidities Comorbidity 1;Comorbidity 2;Other    Comorbidities HTN, bilateral CTR, hernia repair, multiple prior bilateral knee surgeries.    Examination-Activity Limitations Locomotion Level;Transfers;Other    Examination-Participation Restrictions Other    Stability/Clinical Decision Making Stable/Uncomplicated    Rehab Potential Excellent    PT Frequency 2x / week    PT Duration 6 weeks    PT Treatment/Interventions ADLs/Self Care Home Management;Cryotherapy;Electrical Stimulation;Moist Heat;Gait training;Stair training;Functional mobility  training;Therapeutic activities;Therapeutic exercise;Neuromuscular re-education;Manual techniques;Patient/family education;Passive range of motion;Vasopneumatic Device    PT Next Visit Plan Nustep,  PROM, progress into TKA protocol.  Vasopnuematic.  VMS to left quadriceps as needed.    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 [place], [place], [address] Phone: [phone]   Fax:  [phone]  Name: Christopher Fitzpatrick MRN: [id number] Date of Birth: 1966/01/17

## 2021-03-02 ENCOUNTER — Other Ambulatory Visit: Payer: Self-pay

## 2021-03-02 ENCOUNTER — Encounter: Payer: Self-pay | Admitting: Physical Therapy

## 2021-03-02 ENCOUNTER — Ambulatory Visit: Payer: BC Managed Care – PPO | Admitting: Physical Therapy

## 2021-03-02 DIAGNOSIS — G8929 Other chronic pain: Secondary | ICD-10-CM | POA: Diagnosis not present

## 2021-03-02 DIAGNOSIS — M25561 Pain in right knee: Secondary | ICD-10-CM | POA: Diagnosis not present

## 2021-03-02 DIAGNOSIS — M6281 Muscle weakness (generalized): Secondary | ICD-10-CM | POA: Diagnosis not present

## 2021-03-02 DIAGNOSIS — R600 Localized edema: Secondary | ICD-10-CM | POA: Diagnosis not present

## 2021-03-02 DIAGNOSIS — M25661 Stiffness of right knee, not elsewhere classified: Secondary | ICD-10-CM | POA: Diagnosis not present

## 2021-03-02 NOTE — Therapy (Signed)
[facility] [address] [place], [place], [address] Phone: [phone]   Fax:  [phone]  Physical Therapy Treatment  Patient Details  Name: Christopher Fitzpatrick MRN: [id number] Date of Birth: [birth year] Referring Provider (PT): Delena Serve MD   Encounter Date: 03/02/2021   PT End of Session - 03/02/21 0914     Visit Number 5    Number of Visits 12    Date for PT Re-Evaluation 03/30/21    Authorization Type FOTO AT LEAST EVERY 5TH VISIT.  PROGRESS NOTE AT 10TH VISIT.    PT Start Time 0818    PT Stop Time 0900    PT Time Calculation (min) 42 min    Activity Tolerance Patient tolerated treatment well    Behavior During Therapy WFL for tasks assessed/performed             Past Medical History:  Diagnosis Date   Arthritis    "qwhere; knees, ankles, shoulders, hip" (12/03/2017)   Essential hypertension    Gout    Hyperlipidemia    Left leg cellulitis 12/02/2017    Past Surgical History:  Procedure Laterality Date   CARPAL TUNNEL RELEASE Right 11/25/2015   Procedure: CARPAL TUNNEL RELEASE;  Surgeon: Shirlean Kelly, MD;  Location: [facility];  Service: Neurosurgery;  Laterality: Right;   CARPAL TUNNEL RELEASE Left 12/23/2015   Procedure: CARPAL TUNNEL RELEASE;  Surgeon: Shirlean Kelly, MD;  Location: [facility];  Service: Neurosurgery;  Laterality: Left;  Left Carpal Tunnel Release   INGUINAL HERNIA REPAIR Right 1986   KNEE ARTHROSCOPY Right 1987; 1988; 1989; 1990; ~ 1993"   KNEE ARTHROSCOPY Left ~ 2014   KNEE RECONSTRUCTION Right 1985    There were no vitals filed for this visit.   Subjective Assessment - 03/02/21 0819     Subjective COVID-19 screen performed prior to patient entering clinic. MD pleased but gave no remarks regarding swelling.    Patient is accompained by: Family member   Daughter   Pertinent History HTN, bilateral CTR, hernia repair, multiple prior bilateral knee surgeries.    How long can you walk  comfortably? With walker in home.    Patient Stated Goals Get around without knee pain.    Currently in Pain? No/denies                [facility] PT Assessment - 03/02/21 0001       Assessment   Medical Diagnosis Right total knee arthroplasty.    Referring Provider (PT) Delena Serve MD    Onset Date/Surgical Date 02/13/21    Next MD Visit 03/28/2021      Precautions   Precaution Comments No ultrasound.                           [facility] Adult PT Treatment/Exercise - 03/02/21 0001       Knee/Hip Exercises: Stretches   Active Hamstring Stretch Right;4 reps;10 seconds      Knee/Hip Exercises: Aerobic   Recumbent Bike L3, seat 9 x11 min      Knee/Hip Exercises: Standing   Forward Lunges Right;15 reps;5 seconds;Limitations    Forward Lunges Limitations 8" step    Forward Step Up Right;15 reps;Hand Hold: 2;Step Height: 6"   focus on proper hip/knee flexion   Step Down Right;20 reps;Hand Hold: 2;Step Height: 4"    Rocker Board 2 minutes      Knee/Hip Exercises: Supine   Short Arc [employer] Strengthening;Right;3  sets;10 reps    Straight Leg Raises Strengthening;Right;2 sets;10 reps      Modalities   Modalities Vasopneumatic      Vasopneumatic   Number Minutes Vasopneumatic  10 minutes    Vasopnuematic Location  Knee    Vasopneumatic Pressure Low    Vasopneumatic Temperature  34/edema                         PT Long Term Goals - 02/27/21 3299       PT LONG TERM GOAL #1   Title Independent with a HEP.    Time 6    Period Weeks    Status On-going      PT LONG TERM GOAL #2   Title Full active right knee extension in order to normalize gait.    Time 6    Period Weeks    Status On-going      PT LONG TERM GOAL #3   Title Active knee flexion to 115 degrees+ so the patient can perform functional tasks and do so with pain not > 2-3/10.    Time 6    Period Weeks    Status Achieved      PT LONG TERM GOAL #4   Title Increase right hip and  knee strength to a solid 4+/5 to provide good stability for accomplishment of functional activities.    Time 6    Period Weeks    Status On-going      PT LONG TERM GOAL #5   Title Perform a reciprocating stair gait with one railing with pain not > 2-3/10.    Time 6    Period Weeks    Status On-going                   Plan - 03/02/21 0917     Clinical Impression Statement Patient presented in clinic with no complaints. Aquacell dressing removed with steristrips in place over incision. Patient instructed to allow steristrips to remove on their own. Patient able to tolerate progression of ROM and stretching with greater stretch in HS. Patient reports he is now independent with elevating RLE. Improved quad activation noted as well but weakness still noted with quad. Minimal R extensor lag noted with SLR. Normal vasopneumatic response noted following removal of the modality.    Personal Factors and Comorbidities Comorbidity 1;Comorbidity 2;Other    Comorbidities HTN, bilateral CTR, hernia repair, multiple prior bilateral knee surgeries.    Examination-Activity Limitations Locomotion Level;Transfers;Other    Examination-Participation Restrictions Other    Stability/Clinical Decision Making Stable/Uncomplicated    Rehab Potential Excellent    PT Frequency 2x / week    PT Duration 6 weeks    PT Treatment/Interventions ADLs/Self Care Home Management;Cryotherapy;Electrical Stimulation;Moist Heat;Gait training;Stair training;Functional mobility training;Therapeutic activities;Therapeutic exercise;Neuromuscular re-education;Manual techniques;Patient/family education;Passive range of motion;Vasopneumatic Device    PT Next Visit Plan Nustep, PROM, progress into TKA protocol.  Vasopnuematic.  VMS to left quadriceps as needed.    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

## 2021-03-07 ENCOUNTER — Encounter: Payer: Self-pay | Admitting: Physical Therapy

## 2021-03-07 ENCOUNTER — Ambulatory Visit: Payer: BC Managed Care – PPO | Admitting: Physical Therapy

## 2021-03-07 ENCOUNTER — Other Ambulatory Visit: Payer: Self-pay

## 2021-03-07 DIAGNOSIS — M6281 Muscle weakness (generalized): Secondary | ICD-10-CM | POA: Diagnosis not present

## 2021-03-07 DIAGNOSIS — G8929 Other chronic pain: Secondary | ICD-10-CM

## 2021-03-07 DIAGNOSIS — M25661 Stiffness of right knee, not elsewhere classified: Secondary | ICD-10-CM

## 2021-03-07 DIAGNOSIS — R600 Localized edema: Secondary | ICD-10-CM

## 2021-03-07 DIAGNOSIS — M25561 Pain in right knee: Secondary | ICD-10-CM | POA: Diagnosis not present

## 2021-03-07 NOTE — Therapy (Signed)
[facility] [address] [place], [place], [address] Phone: [phone]   Fax:  [phone]  Physical Therapy Treatment  Patient Details  Name: Christopher Fitzpatrick MRN: [id number] Date of Birth: 17-Jul-1966 Referring Provider (PT): Delena Serve MD   Encounter Date: 03/07/2021   PT End of Session - 03/07/21 0817     Visit Number 6    Number of Visits 12    Date for PT Re-Evaluation 03/30/21    Authorization Type FOTO AT LEAST EVERY 5TH VISIT.  PROGRESS NOTE AT 10TH VISIT.    PT Start Time [phone]    PT Stop Time 0904    PT Time Calculation (min) 48 min    Activity Tolerance Patient tolerated treatment well    Behavior During Therapy WFL for tasks assessed/performed             Past Medical History:  Diagnosis Date   Arthritis    "qwhere; knees, ankles, shoulders, hip" (12/03/2017)   Essential hypertension    Gout    Hyperlipidemia    Left leg cellulitis 12/02/2017    Past Surgical History:  Procedure Laterality Date   CARPAL TUNNEL RELEASE Right 11/25/2015   Procedure: CARPAL TUNNEL RELEASE;  Surgeon: Shirlean Kelly, MD;  Location: [facility];  Service: Neurosurgery;  Laterality: Right;   CARPAL TUNNEL RELEASE Left 12/23/2015   Procedure: CARPAL TUNNEL RELEASE;  Surgeon: Shirlean Kelly, MD;  Location: [facility];  Service: Neurosurgery;  Laterality: Left;  Left Carpal Tunnel Release   INGUINAL HERNIA REPAIR Right 1986   KNEE ARTHROSCOPY Right 1987; 1988; 1989; 1990; ~ 1993"   KNEE ARTHROSCOPY Left ~ 2014   KNEE RECONSTRUCTION Right 1985    There were no vitals filed for this visit.   Subjective Assessment - 03/07/21 0815     Subjective COVID-19 screen performed prior to patient entering clinic. Reports that his swelling is still there.    Pertinent History HTN, bilateral CTR, hernia repair, multiple prior bilateral knee surgeries.    How long can you walk comfortably? With walker in home.    Patient Stated Goals Get  around without knee pain.    Currently in Pain? No/denies                [facility] PT Assessment - 03/07/21 0001       Assessment   Medical Diagnosis Right total knee arthroplasty.    Referring Provider (PT) Delena Serve MD    Onset Date/Surgical Date 02/13/21    Next MD Visit 03/28/2021      Precautions   Precaution Comments No ultrasound.                           [facility] Adult PT Treatment/Exercise - 03/07/21 0001       Knee/Hip Exercises: Aerobic   Recumbent Bike L3, seat 9 x12 min      Knee/Hip Exercises: Standing   Forward Lunges Right;20 reps;4 seconds;Limitations    Forward Lunges Limitations 8" step    Forward Step Up Right;20 reps;Hand Hold: 2;Step Height: 8"    Step Down Right;20 reps;Hand Hold: 2;Step Height: 6"      Knee/Hip Exercises: Supine   Short Arc Quad Sets Strengthening;Right;3 sets;10 reps;Limitations    Short Arc Quad Sets Limitations 3#    Straight Leg Raises Strengthening;Right;2 sets;10 reps    Straight Leg Raise with External Rotation Strengthening;Right;2 sets;10 reps      Modalities   Modalities  Vasopneumatic      Vasopneumatic   Number Minutes Vasopneumatic  10 minutes    Vasopnuematic Location  Knee    Vasopneumatic Pressure Low    Vasopneumatic Temperature  34/edema                         PT Long Term Goals - 02/27/21 6803       PT LONG TERM GOAL #1   Title Independent with a HEP.    Time 6    Period Weeks    Status On-going      PT LONG TERM GOAL #2   Title Full active right knee extension in order to normalize gait.    Time 6    Period Weeks    Status On-going      PT LONG TERM GOAL #3   Title Active knee flexion to 115 degrees+ so the patient can perform functional tasks and do so with pain not > 2-3/10.    Time 6    Period Weeks    Status Achieved      PT LONG TERM GOAL #4   Title Increase right hip and knee strength to a solid 4+/5 to provide good stability for accomplishment of  functional activities.    Time 6    Period Weeks    Status On-going      PT LONG TERM GOAL #5   Title Perform a reciprocating stair gait with one railing with pain not > 2-3/10.    Time 6    Period Weeks    Status On-going                   Plan - 03/07/21 2122     Clinical Impression Statement Patient presented in clinic with no steristrips over incision as he states that came off after he sweated while picking blueberries over the weekend. Patient able to tolerate therex well with reports of weakness of TKE quad with step downs. Patient demonstrating improved active quad contraction thus minimal weight added before progression to LAQ. Weakness also notable with SLR with ER. Normal vasopneumatic response noted following removal of the modality.    Personal Factors and Comorbidities Comorbidity 1;Comorbidity 2;Other    Comorbidities HTN, bilateral CTR, hernia repair, multiple prior bilateral knee surgeries.    Examination-Activity Limitations Locomotion Level;Transfers;Other    Examination-Participation Restrictions Other    Stability/Clinical Decision Making Stable/Uncomplicated    Rehab Potential Excellent    PT Frequency 2x / week    PT Duration 6 weeks    PT Treatment/Interventions ADLs/Self Care Home Management;Cryotherapy;Electrical Stimulation;Moist Heat;Gait training;Stair training;Functional mobility training;Therapeutic activities;Therapeutic exercise;Neuromuscular re-education;Manual techniques;Patient/family education;Passive range of motion;Vasopneumatic Device    PT Next Visit Plan Nustep, PROM, progress into TKA protocol.  Vasopnuematic.  VMS to left quadriceps as needed.    Consulted and Agree with Plan of Care Patient             Patient will benefit from skilled therapeutic intervention in order to improve the following deficits and impairments:  Impaired UE functional use, Pain, Decreased activity tolerance, Decreased range of motion, Decreased strength,  Increased edema  Visit Diagnosis: Chronic pain of right knee  Muscle weakness (generalized)  Stiffness of right knee, not elsewhere classified  Localized edema     Problem List Patient Active Problem List   Diagnosis Date Noted   Idiopathic chronic gout of right knee with tophus 10/11/2020   Essential hypertension 02/24/2020   Chronic midline  low back pain 02/24/2020   Chronic pain of right knee 02/24/2020    Marvell Fuller, PTA 03/07/2021, 9:43 AM  [facility] [address] [place], [place], [address] Phone: [phone]   Fax:  [phone]  Name: Christopher Fitzpatrick MRN: [id number] Date of Birth: July 01, 1966

## 2021-03-10 ENCOUNTER — Encounter: Payer: Self-pay | Admitting: Physical Therapy

## 2021-03-10 ENCOUNTER — Ambulatory Visit: Payer: BC Managed Care – PPO | Admitting: Physical Therapy

## 2021-03-10 ENCOUNTER — Other Ambulatory Visit: Payer: Self-pay

## 2021-03-10 DIAGNOSIS — G8929 Other chronic pain: Secondary | ICD-10-CM | POA: Diagnosis not present

## 2021-03-10 DIAGNOSIS — R600 Localized edema: Secondary | ICD-10-CM

## 2021-03-10 DIAGNOSIS — M25661 Stiffness of right knee, not elsewhere classified: Secondary | ICD-10-CM

## 2021-03-10 DIAGNOSIS — M6281 Muscle weakness (generalized): Secondary | ICD-10-CM | POA: Diagnosis not present

## 2021-03-10 DIAGNOSIS — M25561 Pain in right knee: Secondary | ICD-10-CM | POA: Diagnosis not present

## 2021-03-10 NOTE — Therapy (Signed)
[facility] [address] [place], [place], [address] Phone: [phone]   Fax:  [phone]  Physical Therapy Treatment  Patient Details  Name: Christopher Fitzpatrick MRN: [id number] Date of Birth: November 09, 1965 Referring Provider (PT): Delena Serve MD   Encounter Date: 03/10/2021   PT End of Session - 03/10/21 0910     Visit Number 7    Number of Visits 12    Date for PT Re-Evaluation 03/30/21    Authorization Type FOTO AT LEAST EVERY 5TH VISIT.  PROGRESS NOTE AT 10TH VISIT.    PT Start Time 0817    PT Stop Time 0905    PT Time Calculation (min) 48 min    Activity Tolerance Patient tolerated treatment well    Behavior During Therapy WFL for tasks assessed/performed             Past Medical History:  Diagnosis Date   Arthritis    "qwhere; knees, ankles, shoulders, hip" (12/03/2017)   Essential hypertension    Gout    Hyperlipidemia    Left leg cellulitis 12/02/2017    Past Surgical History:  Procedure Laterality Date   CARPAL TUNNEL RELEASE Right 11/25/2015   Procedure: CARPAL TUNNEL RELEASE;  Surgeon: Shirlean Kelly, MD;  Location: [facility];  Service: Neurosurgery;  Laterality: Right;   CARPAL TUNNEL RELEASE Left 12/23/2015   Procedure: CARPAL TUNNEL RELEASE;  Surgeon: Shirlean Kelly, MD;  Location: [facility];  Service: Neurosurgery;  Laterality: Left;  Left Carpal Tunnel Release   INGUINAL HERNIA REPAIR Right 1986   KNEE ARTHROSCOPY Right 1987; 1988; 1989; 1990; ~ 1993"   KNEE ARTHROSCOPY Left ~ 2014   KNEE RECONSTRUCTION Right 1985    There were no vitals filed for this visit.   Subjective Assessment - 03/10/21 0828     Subjective COVID-19 screen performed prior to patient entering clinic. Reports no pain just minimal soreness.    Pertinent History HTN, bilateral CTR, hernia repair, multiple prior bilateral knee surgeries.    How long can you walk comfortably? With walker in home.    Patient Stated Goals Get around  without knee pain.    Currently in Pain? No/denies                [facility] PT Assessment - 03/10/21 0001       Assessment   Medical Diagnosis Right total knee arthroplasty.    Referring Provider (PT) Delena Serve MD    Onset Date/Surgical Date 02/13/21    Next MD Visit 03/28/2021      Precautions   Precaution Comments No ultrasound.                           [facility] Adult PT Treatment/Exercise - 03/10/21 0001       Knee/Hip Exercises: Aerobic   Recumbent Bike L4, seat 6 x14 min      Knee/Hip Exercises: Machines for Strengthening   Cybex Knee Extension 10# 3x10 reps eccentric return    Cybex Knee Flexion 30# 3x10 reps eccentric return    Cybex Leg Press 2 pl, seat 8 x20 reps      Knee/Hip Exercises: Standing   Walking with Sports Cord Backwards, R sidestepping orange XTS x5 rep      Modalities   Modalities Vasopneumatic      Vasopneumatic   Number Minutes Vasopneumatic  10 minutes    Vasopnuematic Location  Knee    Vasopneumatic Pressure Low  Vasopneumatic Temperature  34/edema      Manual Therapy   Manual Therapy Taping    Kinesiotex Edema      Kinesiotix   Edema 2 strips of spider taping to reduce post surgical edema of R knee.                         PT Long Term Goals - 02/27/21 2841       PT LONG TERM GOAL #1   Title Independent with a HEP.    Time 6    Period Weeks    Status On-going      PT LONG TERM GOAL #2   Title Full active right knee extension in order to normalize gait.    Time 6    Period Weeks    Status On-going      PT LONG TERM GOAL #3   Title Active knee flexion to 115 degrees+ so the patient can perform functional tasks and do so with pain not > 2-3/10.    Time 6    Period Weeks    Status Achieved      PT LONG TERM GOAL #4   Title Increase right hip and knee strength to a solid 4+/5 to provide good stability for accomplishment of functional activities.    Time 6    Period Weeks    Status  On-going      PT LONG TERM GOAL #5   Title Perform a reciprocating stair gait with one railing with pain not > 2-3/10.    Time 6    Period Weeks    Status On-going                   Plan - 03/10/21 3244     Clinical Impression Statement Patient presented in clinic with reports of essentially no pain in R knee. Patient introduced to machine strengthening with eccentric cueing for R knee. Continued R knee edema notable although somewhat of a reduction noticed. Patient educated regarding spider taping for edema management and taping applied. Patient educated regarding care of tape while donned. Normal vasopneumatic response noted following removal of the modality.    Personal Factors and Comorbidities Comorbidity 1;Comorbidity 2;Other    Comorbidities HTN, bilateral CTR, hernia repair, multiple prior bilateral knee surgeries.    Examination-Activity Limitations Locomotion Level;Transfers;Other    Examination-Participation Restrictions Other    Stability/Clinical Decision Making Stable/Uncomplicated    Rehab Potential Excellent    PT Frequency 2x / week    PT Duration 6 weeks    PT Treatment/Interventions ADLs/Self Care Home Management;Cryotherapy;Electrical Stimulation;Moist Heat;Gait training;Stair training;Functional mobility training;Therapeutic activities;Therapeutic exercise;Neuromuscular re-education;Manual techniques;Patient/family education;Passive range of motion;Vasopneumatic Device    PT Next Visit Plan Nustep, PROM, progress into TKA protocol.  Vasopnuematic.  VMS to left quadriceps as needed.    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

## 2021-03-14 ENCOUNTER — Other Ambulatory Visit: Payer: Self-pay

## 2021-03-14 ENCOUNTER — Encounter: Payer: Self-pay | Admitting: Physical Therapy

## 2021-03-14 ENCOUNTER — Ambulatory Visit: Payer: BC Managed Care – PPO | Admitting: Physical Therapy

## 2021-03-14 DIAGNOSIS — R600 Localized edema: Secondary | ICD-10-CM

## 2021-03-14 DIAGNOSIS — G8929 Other chronic pain: Secondary | ICD-10-CM | POA: Diagnosis not present

## 2021-03-14 DIAGNOSIS — M25661 Stiffness of right knee, not elsewhere classified: Secondary | ICD-10-CM | POA: Diagnosis not present

## 2021-03-14 DIAGNOSIS — M6281 Muscle weakness (generalized): Secondary | ICD-10-CM

## 2021-03-14 DIAGNOSIS — M25561 Pain in right knee: Secondary | ICD-10-CM | POA: Diagnosis not present

## 2021-03-14 NOTE — Therapy (Signed)
[facility] [address] [place], [place], [address] Phone: [phone]   Fax:  [phone]  Physical Therapy Treatment  Patient Details  Name: Christopher Fitzpatrick MRN: [id number] Date of Birth: June 11, 1966 Referring Provider (PT): Delena Serve MD   Encounter Date: 03/14/2021   PT End of Session - 03/14/21 0904     Visit Number 8    Number of Visits 12    Date for PT Re-Evaluation 03/30/21    Authorization Type FOTO AT LEAST EVERY 5TH VISIT.  PROGRESS NOTE AT 10TH VISIT.    PT Start Time 0900    PT Stop Time 0954    PT Time Calculation (min) 54 min    Activity Tolerance Patient tolerated treatment well    Behavior During Therapy WFL for tasks assessed/performed             Past Medical History:  Diagnosis Date   Arthritis    "qwhere; knees, ankles, shoulders, hip" (12/03/2017)   Essential hypertension    Gout    Hyperlipidemia    Left leg cellulitis 12/02/2017    Past Surgical History:  Procedure Laterality Date   CARPAL TUNNEL RELEASE Right 11/25/2015   Procedure: CARPAL TUNNEL RELEASE;  Surgeon: Shirlean Kelly, MD;  Location: [facility];  Service: Neurosurgery;  Laterality: Right;   CARPAL TUNNEL RELEASE Left 12/23/2015   Procedure: CARPAL TUNNEL RELEASE;  Surgeon: Shirlean Kelly, MD;  Location: [facility];  Service: Neurosurgery;  Laterality: Left;  Left Carpal Tunnel Release   INGUINAL HERNIA REPAIR Right 1986   KNEE ARTHROSCOPY Right 1987; 1988; 1989; 1990; ~ 1993"   KNEE ARTHROSCOPY Left ~ 2014   KNEE RECONSTRUCTION Right 1985    There were no vitals filed for this visit.   Subjective Assessment - 03/14/21 0905     Subjective COVID-19 screen performed prior to patient entering clinic. Reports no pain just minimal soreness.    Pertinent History HTN, bilateral CTR, hernia repair, multiple prior bilateral knee surgeries.    Patient Stated Goals Get around without knee pain.    Currently in Pain? No/denies                                [facility] Adult PT Treatment/Exercise - 03/14/21 0001       Knee/Hip Exercises: Aerobic   Recumbent Bike L4, seat 6 x14 min      Knee/Hip Exercises: Machines for Strengthening   Cybex Knee Extension 10# 1x10 with eccentric lowering; then 5# isometric hold x 5 sec then ecc lowering x 5 sec (2x10)    Cybex Knee Flexion 50# 3x10 reps eccentric return    Cybex Leg Press 2 pl, seat 7, 1x10, then bil up right down slow x 10      Knee/Hip Exercises: Standing   Terminal Knee Extension Right;10 reps    Theraband Level (Terminal Knee Extension) Level 3 (Green)    Terminal Knee Extension Limitations 5 sec hold    Forward Step Up Right;1 set;10 reps;Hand Hold: 2;Step Height: 4"    Forward Step Up Limitations SLOW with isometric hold of quads and glutes; pt unable to control hyper extension on 6 or 8 inch step    Walking with Sports Cord Backwards, R sidestepping orange XTS x10 rep      Modalities   Modalities Vasopneumatic      Vasopneumatic   Number Minutes Vasopneumatic  10 minutes    Vasopnuematic Location  Knee    Vasopneumatic Pressure Low    Vasopneumatic Temperature  34/edema                         PT Long Term Goals - 02/27/21 1448       PT LONG TERM GOAL #1   Title Independent with a HEP.    Time 6    Period Weeks    Status On-going      PT LONG TERM GOAL #2   Title Full active right knee extension in order to normalize gait.    Time 6    Period Weeks    Status On-going      PT LONG TERM GOAL #3   Title Active knee flexion to 115 degrees+ so the patient can perform functional tasks and do so with pain not > 2-3/10.    Time 6    Period Weeks    Status Achieved      PT LONG TERM GOAL #4   Title Increase right hip and knee strength to a solid 4+/5 to provide good stability for accomplishment of functional activities.    Time 6    Period Weeks    Status On-going      PT LONG TERM GOAL #5   Title Perform a  reciprocating stair gait with one railing with pain not > 2-3/10.    Time 6    Period Weeks    Status On-going                   Plan - 03/14/21 0946     Clinical Impression Statement Patient demonstrates poor quad control with step ups and is weak at end range knee extension. Patient challenged with isometric holds.    Comorbidities HTN, bilateral CTR, hernia repair, multiple prior bilateral knee surgeries.    PT Treatment/Interventions ADLs/Self Care Home Management;Cryotherapy;Electrical Stimulation;Moist Heat;Gait training;Stair training;Functional mobility training;Therapeutic activities;Therapeutic exercise;Neuromuscular re-education;Manual techniques;Patient/family education;Passive range of motion;Vasopneumatic Device    PT Next Visit Plan Focus on quad control and end range strengthening.  Vasopnuematic.             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 [place], [place], [address] Phone: [phone]   Fax:  [phone]  Name: Christopher Fitzpatrick MRN: [id number] Date of Birth: September 05, 1965

## 2021-03-17 ENCOUNTER — Other Ambulatory Visit: Payer: Self-pay

## 2021-03-17 ENCOUNTER — Ambulatory Visit: Payer: BC Managed Care – PPO | Admitting: Physical Therapy

## 2021-03-17 DIAGNOSIS — M6281 Muscle weakness (generalized): Secondary | ICD-10-CM | POA: Diagnosis not present

## 2021-03-17 DIAGNOSIS — M25661 Stiffness of right knee, not elsewhere classified: Secondary | ICD-10-CM

## 2021-03-17 DIAGNOSIS — M25561 Pain in right knee: Secondary | ICD-10-CM | POA: Diagnosis not present

## 2021-03-17 DIAGNOSIS — R600 Localized edema: Secondary | ICD-10-CM | POA: Diagnosis not present

## 2021-03-17 DIAGNOSIS — G8929 Other chronic pain: Secondary | ICD-10-CM

## 2021-03-17 NOTE — Therapy (Signed)
[facility] [address] [place], [place], [address] Phone: [phone]   Fax:  [phone]  Physical Therapy Treatment  Patient Details  Name: Christopher Fitzpatrick MRN: [id number] Date of Birth: 07-28-1966 Referring Provider (PT): Delena Serve MD   Encounter Date: 03/17/2021   PT End of Session - 03/17/21 1028     Visit Number 9    Number of Visits 12    Date for PT Re-Evaluation 03/30/21    Authorization Type FOTO AT LEAST EVERY 5TH VISIT.  PROGRESS NOTE AT 10TH VISIT.    PT Start Time 0815    PT Stop Time 0907    PT Time Calculation (min) 52 min    Activity Tolerance Patient tolerated treatment well    Behavior During Therapy WFL for tasks assessed/performed             Past Medical History:  Diagnosis Date   Arthritis    "qwhere; knees, ankles, shoulders, hip" (12/03/2017)   Essential hypertension    Gout    Hyperlipidemia    Left leg cellulitis 12/02/2017    Past Surgical History:  Procedure Laterality Date   CARPAL TUNNEL RELEASE Right 11/25/2015   Procedure: CARPAL TUNNEL RELEASE;  Surgeon: Shirlean Kelly, MD;  Location: [facility];  Service: Neurosurgery;  Laterality: Right;   CARPAL TUNNEL RELEASE Left 12/23/2015   Procedure: CARPAL TUNNEL RELEASE;  Surgeon: Shirlean Kelly, MD;  Location: [facility];  Service: Neurosurgery;  Laterality: Left;  Left Carpal Tunnel Release   INGUINAL HERNIA REPAIR Right 1986   KNEE ARTHROSCOPY Right 1987; 1988; 1989; 1990; ~ 1993"   KNEE ARTHROSCOPY Left ~ 2014   KNEE RECONSTRUCTION Right 1985    There were no vitals filed for this visit.   Subjective Assessment - 03/17/21 1028     Subjective COVID-19 screen performed prior to patient entering clinic.  Doing good.    Patient is accompained by: Family member    Pertinent History HTN, bilateral CTR, hernia repair, multiple prior bilateral knee surgeries.    How long can you walk comfortably? With walker in home.    Patient Stated  Goals Get around without knee pain.    Currently in Pain? Yes    Pain Score 2     Pain Orientation Right    Pain Descriptors / Indicators Sore    Pain Type Surgical pain    Pain Onset More than a month ago                               [facility] Adult PT Treatment/Exercise - 03/17/21 0001       Exercises   Exercises Knee/Hip      Knee/Hip Exercises: Aerobic   Recumbent Bike level x 16 minutes.      Knee/Hip Exercises: Machines for Strengthening   Cybex Knee Extension 10# x 3 minutes.    Cybex Knee Flexion 40# x 3 minutes.    Cybex Leg Press 3 plates x 3 minutes.      Vasopneumatic   Number Minutes Vasopneumatic  15 minutes    Vasopnuematic Location  --   Right knee.   Vasopneumatic Pressure Medium      Manual Therapy   Manual Therapy Passive ROM    Passive ROM In supine:  PROM x 4 minutes to patient's right knee.  PT Long Term Goals - 02/27/21 1517       PT LONG TERM GOAL #1   Title Independent with a HEP.    Time 6    Period Weeks    Status On-going      PT LONG TERM GOAL #2   Title Full active right knee extension in order to normalize gait.    Time 6    Period Weeks    Status On-going      PT LONG TERM GOAL #3   Title Active knee flexion to 115 degrees+ so the patient can perform functional tasks and do so with pain not > 2-3/10.    Time 6    Period Weeks    Status Achieved      PT LONG TERM GOAL #4   Title Increase right hip and knee strength to a solid 4+/5 to provide good stability for accomplishment of functional activities.    Time 6    Period Weeks    Status On-going      PT LONG TERM GOAL #5   Title Perform a reciprocating stair gait with one railing with pain not > 2-3/10.    Time 6    Period Weeks    Status On-going                   Plan - 03/17/21 1031     Clinical Impression Statement Patient doing very well.  Right knee active flexion to 115 degrees and passive to 120  degrees.    Personal Factors and Comorbidities Comorbidity 1;Comorbidity 2;Other    Comorbidities HTN, bilateral CTR, hernia repair, multiple prior bilateral knee surgeries.    PT Frequency 2x / week    PT Duration 6 weeks    PT Treatment/Interventions ADLs/Self Care Home Management;Cryotherapy;Electrical Stimulation;Moist Heat;Gait training;Stair training;Functional mobility training;Therapeutic activities;Therapeutic exercise;Neuromuscular re-education;Manual techniques;Patient/family education;Passive range of motion;Vasopneumatic Device    PT Next Visit Plan Focus on quad control and end range strengthening.  Vasopnuematic.    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

## 2021-03-21 ENCOUNTER — Ambulatory Visit: Payer: BC Managed Care – PPO | Attending: Sports Medicine | Admitting: Physical Therapy

## 2021-03-21 ENCOUNTER — Encounter: Payer: Self-pay | Admitting: Physical Therapy

## 2021-03-21 ENCOUNTER — Encounter: Payer: BC Managed Care – PPO | Admitting: Physical Therapy

## 2021-03-21 ENCOUNTER — Other Ambulatory Visit: Payer: Self-pay

## 2021-03-21 DIAGNOSIS — M6281 Muscle weakness (generalized): Secondary | ICD-10-CM | POA: Insufficient documentation

## 2021-03-21 DIAGNOSIS — M25561 Pain in right knee: Secondary | ICD-10-CM | POA: Diagnosis not present

## 2021-03-21 DIAGNOSIS — M25661 Stiffness of right knee, not elsewhere classified: Secondary | ICD-10-CM | POA: Insufficient documentation

## 2021-03-21 DIAGNOSIS — R600 Localized edema: Secondary | ICD-10-CM | POA: Diagnosis not present

## 2021-03-21 DIAGNOSIS — G8929 Other chronic pain: Secondary | ICD-10-CM | POA: Insufficient documentation

## 2021-03-21 NOTE — Therapy (Signed)
[facility] [place], [place], [address] Phone: [phone]   Fax:  [phone]  Physical Therapy Treatment  Patient Details  Name: Christopher Fitzpatrick MRN: [id number] Date of Birth: 09/06/1965 Referring Provider (PT): Christopher Sax MD   Encounter Date: 03/21/2021   PT End of Session - 03/21/21 0912     Visit Number 10    Number of Visits 12    Date for PT Re-Evaluation 03/30/21    Authorization Type FOTO AT LEAST EVERY 5TH VISIT.  PROGRESS NOTE AT 10TH VISIT.    PT Start Time 0818    PT Stop Time 0901    PT Time Calculation (min) 43 min    Activity Tolerance Patient tolerated treatment well    Behavior During Therapy WFL for tasks assessed/performed             Past Medical History:  Diagnosis Date   Arthritis    "qwhere; knees, ankles, shoulders, hip" (12/03/2017)   Essential hypertension    Gout    Hyperlipidemia    Left leg cellulitis 12/02/2017    Past Surgical History:  Procedure Laterality Date   CARPAL TUNNEL RELEASE Right 11/25/2015   Procedure: CARPAL TUNNEL RELEASE;  Surgeon: Jovita Gamma, MD;  Location: [facility];  Service: Neurosurgery;  Laterality: Right;   CARPAL TUNNEL RELEASE Left 12/23/2015   Procedure: CARPAL TUNNEL RELEASE;  Surgeon: Jovita Gamma, MD;  Location: [facility];  Service: Neurosurgery;  Laterality: Left;  Left Carpal Tunnel Release   INGUINAL HERNIA REPAIR Right 1986   KNEE ARTHROSCOPY Right 1987; 1988; 1989; 1990; ~ 1993"   KNEE ARTHROSCOPY Left ~ 2014   KNEE RECONSTRUCTION Right 1985    There were no vitals filed for this visit.   Subjective Assessment - 03/21/21 0818     Subjective COVID-19 screen performed prior to patient entering clinic. Only pain in first few steps upon waking.    Patient is accompained by: Family member   Daughter, [name]   Pertinent History HTN, bilateral CTR, hernia repair, multiple prior bilateral knee surgeries.    How long can you walk  comfortably? With walker in home.    Patient Stated Goals Get around without knee pain.    Currently in Pain? No/denies                [facility] PT Assessment - 03/21/21 0001       Assessment   Medical Diagnosis Right total knee arthroplasty.    Referring Provider (PT) Christopher Sax MD    Onset Date/Surgical Date 02/13/21    Next MD Visit 03/28/2021      Precautions   Precaution Comments No ultrasound.                           [facility] Adult PT Treatment/Exercise - 03/21/21 0001       Knee/Hip Exercises: Aerobic   Recumbent Bike L4, seat 8 x15 min      Knee/Hip Exercises: Machines for Strengthening   Cybex Knee Extension 20# 3x10 reps eccentric    Cybex Knee Flexion 40# 3x10 reps eccentric    Cybex Leg Press 2 pl, seat 7, 3x10 reps eccentric      Knee/Hip Exercises: Standing   Lateral Step Up Right;2 sets;10 reps;Hand Hold: 2;Step Height: 8"    Forward Step Up Right;3 sets;10 reps;Hand Hold: 2;Step Height: 8"      Knee/Hip Exercises: Supine   Single Leg Bridge Strengthening;Right;2  sets;10 reps                         PT Long Term Goals - 02/27/21 7262       PT LONG TERM GOAL #1   Title Independent with a HEP.    Time 6    Period Weeks    Status On-going      PT LONG TERM GOAL #2   Title Full active right knee extension in order to normalize gait.    Time 6    Period Weeks    Status On-going      PT LONG TERM GOAL #3   Title Active knee flexion to 115 degrees+ so the patient can perform functional tasks and do so with pain not > 2-3/10.    Time 6    Period Weeks    Status Achieved      PT LONG TERM GOAL #4   Title Increase right hip and knee strength to a solid 4+/5 to provide good stability for accomplishment of functional activities.    Time 6    Period Weeks    Status On-going      PT LONG TERM GOAL #5   Title Perform a reciprocating stair gait with one railing with pain not > 2-3/10.    Time 6    Period Weeks     Status On-going                   Plan - 03/21/21 0355     Clinical Impression Statement Patient progressed to more eccentric strengthening today. Less edema notable in R knee and nodules of inflammation in medial and lateral knee have reduced greatly. Higher step ups completed to ensure patient's ability to get into work truck.    Personal Factors and Comorbidities Comorbidity 1;Comorbidity 2;Other    Comorbidities HTN, bilateral CTR, hernia repair, multiple prior bilateral knee surgeries.    Examination-Activity Limitations Locomotion Level;Transfers;Other    Examination-Participation Restrictions Other    Stability/Clinical Decision Making Stable/Uncomplicated    Rehab Potential Excellent    PT Frequency 2x / week    PT Duration 6 weeks    PT Treatment/Interventions ADLs/Self Care Home Management;Cryotherapy;Electrical Stimulation;Moist Heat;Gait training;Stair training;Functional mobility training;Therapeutic activities;Therapeutic exercise;Neuromuscular re-education;Manual techniques;Patient/family education;Passive range of motion;Vasopneumatic Device    PT Next Visit Plan FOTO and eccentric control. Assess goals.    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 [place], [place], [address] Phone: [phone]   Fax:  [phone]  Name: Christopher Fitzpatrick MRN: [id number] Date of Birth: 10/18/1965  Progress Note Reporting Period 02/16/21 to 03/21/21.  See note below for Objective Data and Assessment of Progress/Goals. Excellent progress  with goal #3 met currently.    Mali Applegate MPT

## 2021-03-24 ENCOUNTER — Encounter: Payer: Self-pay | Admitting: Physical Therapy

## 2021-03-24 ENCOUNTER — Ambulatory Visit: Payer: BC Managed Care – PPO | Admitting: Physical Therapy

## 2021-03-24 ENCOUNTER — Other Ambulatory Visit: Payer: Self-pay

## 2021-03-24 DIAGNOSIS — M6281 Muscle weakness (generalized): Secondary | ICD-10-CM

## 2021-03-24 DIAGNOSIS — M25661 Stiffness of right knee, not elsewhere classified: Secondary | ICD-10-CM | POA: Diagnosis not present

## 2021-03-24 DIAGNOSIS — G8929 Other chronic pain: Secondary | ICD-10-CM | POA: Diagnosis not present

## 2021-03-24 DIAGNOSIS — R600 Localized edema: Secondary | ICD-10-CM | POA: Diagnosis not present

## 2021-03-24 DIAGNOSIS — M25561 Pain in right knee: Secondary | ICD-10-CM | POA: Diagnosis not present

## 2021-03-24 NOTE — Therapy (Signed)
[facility] [place], [place], [address] Phone: [phone]   Fax:  [phone]  Physical Therapy Treatment  Patient Details  Name: Christopher Fitzpatrick MRN: [id number] Date of Birth: Apr 19, 1966 Referring Provider (PT): Becky Sax MD   Encounter Date: 03/24/2021   PT End of Session - 03/24/21 0819     Visit Number 11    Number of Visits 12    Date for PT Re-Evaluation 03/30/21    Authorization Type FOTO AT LEAST EVERY 5TH VISIT.  PROGRESS NOTE AT 10TH VISIT.    PT Start Time [id number]    PT Stop Time 0901    PT Time Calculation (min) 44 min    Activity Tolerance Patient tolerated treatment well    Behavior During Therapy WFL for tasks assessed/performed             Past Medical History:  Diagnosis Date   Arthritis    "qwhere; knees, ankles, shoulders, hip" (12/03/2017)   Essential hypertension    Gout    Hyperlipidemia    Left leg cellulitis 12/02/2017    Past Surgical History:  Procedure Laterality Date   CARPAL TUNNEL RELEASE Right 11/25/2015   Procedure: CARPAL TUNNEL RELEASE;  Surgeon: Jovita Gamma, MD;  Location: [facility];  Service: Neurosurgery;  Laterality: Right;   CARPAL TUNNEL RELEASE Left 12/23/2015   Procedure: CARPAL TUNNEL RELEASE;  Surgeon: Jovita Gamma, MD;  Location: [facility];  Service: Neurosurgery;  Laterality: Left;  Left Carpal Tunnel Release   INGUINAL HERNIA REPAIR Right 1986   KNEE ARTHROSCOPY Right 1987; 1988; 1989; 1990; ~ 1993"   KNEE ARTHROSCOPY Left ~ 2014   KNEE RECONSTRUCTION Right 1985    There were no vitals filed for this visit.   Subjective Assessment - 03/24/21 0818     Subjective COVID-19 screen performed prior to patient entering clinic. No complaints.    Patient is accompained by: Family member   Daughter, [name]   Pertinent History HTN, bilateral CTR, hernia repair, multiple prior bilateral knee surgeries.    How long can you walk comfortably? With walker in home.     Patient Stated Goals Get around without knee pain.    Currently in Pain? No/denies                [facility] PT Assessment - 03/24/21 0001       Assessment   Medical Diagnosis Right total knee arthroplasty.    Referring Provider (PT) Becky Sax MD    Onset Date/Surgical Date 02/13/21    Next MD Visit 03/28/2021      Precautions   Precaution Comments No ultrasound.      ROM / Strength   AROM / PROM / Strength AROM      AROM   Overall AROM  Within functional limits for tasks performed    AROM Assessment Site Knee    Right/Left Knee Right    Right Knee Extension 2    Right Knee Flexion 120                           [facility] Adult PT Treatment/Exercise - 03/24/21 0001       Ambulation/Gait   Stairs Yes    Stairs Assistance 7: Independent    Stair Management Technique No rails;Alternating pattern;Forwards    Number of Stairs 4    Height of Stairs 6.5      Knee/Hip Exercises: Aerobic   Recumbent Bike  L4, seat 8 x15 min      Knee/Hip Exercises: Machines for Strengthening   Cybex Knee Extension 20# 3x10 reps eccentric    Cybex Knee Flexion 40# 3x10 reps eccentric    Cybex Leg Press 3 pl, seat 7, 3x10 reps eccentric      Knee/Hip Exercises: Standing   Walking with Sports Cord 3D walk orange XTS x10 reps                         PT Long Term Goals - 03/24/21 2536       PT LONG TERM GOAL #1   Title Independent with a HEP.    Time 6    Period Weeks    Status Unable to assess      PT LONG TERM GOAL #2   Title Full active right knee extension in order to normalize gait.    Time 6    Period Weeks    Status Partially Met      PT LONG TERM GOAL #3   Title Active knee flexion to 115 degrees+ so the patient can perform functional tasks and do so with pain not > 2-3/10.    Time 6    Period Weeks    Status Achieved      PT LONG TERM GOAL #4   Title Increase right hip and knee strength to a solid 4+/5 to provide good stability for  accomplishment of functional activities.    Time 6    Period Weeks    Status On-going      PT LONG TERM GOAL #5   Title Perform a reciprocating stair gait with one railing with pain not > 2-3/10.    Time 6    Period Weeks    Status Achieved                   Plan - 03/24/21 0904     Clinical Impression Statement Patient presented in clinic with no functional limitations. Stair gait was WNL and no compensatory movements noted. Continued primary focus of eccentric strengthening. AROM of R knee measured as 2-120 deg. Patient educated that padding to kneel may be required while kneeling for work responsibilities.    Personal Factors and Comorbidities Comorbidity 1;Comorbidity 2;Other    Comorbidities HTN, bilateral CTR, hernia repair, multiple prior bilateral knee surgeries.    Examination-Activity Limitations Locomotion Level;Transfers;Other    Examination-Participation Restrictions Other    Stability/Clinical Decision Making Stable/Uncomplicated    Rehab Potential Excellent    PT Frequency 2x / week    PT Duration 6 weeks    PT Treatment/Interventions ADLs/Self Care Home Management;Cryotherapy;Electrical Stimulation;Moist Heat;Gait training;Stair training;Functional mobility training;Therapeutic activities;Therapeutic exercise;Neuromuscular re-education;Manual techniques;Patient/family education;Passive range of motion;Vasopneumatic Device    PT Next Visit Plan FOTO and D/C    Consulted and Agree with Plan of Care Patient             Patient will benefit from skilled therapeutic intervention in order to improve the following deficits and impairments:  Impaired UE functional use, Pain, Decreased activity tolerance, Decreased range of motion, Decreased strength, Increased edema  Visit Diagnosis: Muscle weakness (generalized)  Stiffness of right knee, not elsewhere classified  Chronic pain of right knee  Localized edema     Problem List Patient Active Problem List    Diagnosis Date Noted   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

## 2021-03-28 ENCOUNTER — Encounter: Payer: BC Managed Care – PPO | Admitting: Physical Therapy

## 2021-03-28 DIAGNOSIS — Z471 Aftercare following joint replacement surgery: Secondary | ICD-10-CM | POA: Diagnosis not present

## 2021-03-28 DIAGNOSIS — Z96651 Presence of right artificial knee joint: Secondary | ICD-10-CM | POA: Diagnosis not present

## 2021-03-29 ENCOUNTER — Encounter: Payer: BC Managed Care – PPO | Admitting: Physical Therapy

## 2021-03-31 ENCOUNTER — Ambulatory Visit: Payer: BC Managed Care – PPO | Admitting: *Deleted

## 2021-04-25 ENCOUNTER — Telehealth: Payer: BC Managed Care – PPO | Admitting: Physician Assistant

## 2021-04-25 DIAGNOSIS — L03115 Cellulitis of right lower limb: Secondary | ICD-10-CM | POA: Diagnosis not present

## 2021-04-25 MED ORDER — SULFAMETHOXAZOLE-TRIMETHOPRIM 800-160 MG PO TABS
1.0000 | ORAL_TABLET | Freq: Two times a day (BID) | ORAL | 0 refills | Status: DC
Start: 2021-04-25 — End: 2021-06-27

## 2021-04-25 NOTE — Progress Notes (Signed)
E Visit for Cellulitis ° °We are sorry that you are not feeling well. Here is how we plan to help! ° °Based on what you shared with me it looks like you have cellulitis.  Cellulitis looks like areas of skin redness, swelling, and warmth; it develops as a result of bacteria entering under the skin. Little red spots and/or bleeding can be seen in skin, and tiny surface sacs containing fluid can occur. Fever can be present. Cellulitis is almost always on one side of a body, and the lower limbs are the most common site of involvement.  ° °I have prescribed:  Bactrim DS 1 tablet by mouth twice a day for 7 days ° °HOME CARE: ° °Take your medications as ordered and take all of them, even if the skin irritation appears to be healing.  ° °GET HELP RIGHT AWAY IF: ° °Symptoms that don't begin to go away within 48 hours. °Severe redness persists or worsens °If the area turns color, spreads or swells. °If it blisters and opens, develops yellow-brown crust or bleeds. °You develop a fever or chills. °If the pain increases or becomes unbearable.  °Are unable to keep fluids and food down. ° °MAKE SURE YOU  ° °Understand these instructions. °Will watch your condition. °Will get help right away if you are not doing well or get worse. ° °Thank you for choosing an e-visit. ° °Your e-visit answers were reviewed by a board certified advanced clinical practitioner to complete your personal care plan. Depending upon the condition, your plan could have included both over the counter or prescription medications. ° °Please review your pharmacy choice. Make sure the pharmacy is open so you can pick up prescription now. If there is a problem, you may contact your provider through MyChart messaging and have the prescription routed to another pharmacy.  Your safety is important to us. If you have drug allergies check your prescription carefully.  ° °For the next 24 hours you can use MyChart to ask questions about today's visit, request a  non-urgent call back, or ask for a work or school excuse. °You will get an email in the next two days asking about your experience. I hope that your e-visit has been valuable and will speed your recovery. ° °

## 2021-04-25 NOTE — Progress Notes (Signed)
I have spent 5 minutes in review of e-visit questionnaire, review and updating patient chart, medical decision making and response to patient.   Vallen Calabrese Cody Taytem Ghattas, PA-C    

## 2021-04-27 ENCOUNTER — Ambulatory Visit: Payer: BC Managed Care – PPO | Admitting: Family Medicine

## 2021-05-19 ENCOUNTER — Ambulatory Visit: Payer: BC Managed Care – PPO | Admitting: Family Medicine

## 2021-06-08 ENCOUNTER — Ambulatory Visit: Payer: BC Managed Care – PPO | Admitting: Family Medicine

## 2021-06-12 ENCOUNTER — Encounter: Payer: Self-pay | Admitting: Family Medicine

## 2021-06-21 DIAGNOSIS — L570 Actinic keratosis: Secondary | ICD-10-CM | POA: Diagnosis not present

## 2021-06-21 DIAGNOSIS — D2372 Other benign neoplasm of skin of left lower limb, including hip: Secondary | ICD-10-CM | POA: Diagnosis not present

## 2021-06-21 DIAGNOSIS — D235 Other benign neoplasm of skin of trunk: Secondary | ICD-10-CM | POA: Diagnosis not present

## 2021-06-21 DIAGNOSIS — D044 Carcinoma in situ of skin of scalp and neck: Secondary | ICD-10-CM | POA: Diagnosis not present

## 2021-06-21 DIAGNOSIS — L821 Other seborrheic keratosis: Secondary | ICD-10-CM | POA: Diagnosis not present

## 2021-06-21 DIAGNOSIS — Z85828 Personal history of other malignant neoplasm of skin: Secondary | ICD-10-CM | POA: Diagnosis not present

## 2021-06-27 ENCOUNTER — Encounter: Payer: Self-pay | Admitting: Family Medicine

## 2021-06-27 ENCOUNTER — Ambulatory Visit (INDEPENDENT_AMBULATORY_CARE_PROVIDER_SITE_OTHER): Payer: BC Managed Care – PPO | Admitting: Family Medicine

## 2021-06-27 ENCOUNTER — Other Ambulatory Visit: Payer: Self-pay

## 2021-06-27 VITALS — BP 134/82 | HR 73 | Temp 97.4°F | Ht 70.0 in | Wt 273.6 lb

## 2021-06-27 DIAGNOSIS — E782 Mixed hyperlipidemia: Secondary | ICD-10-CM | POA: Insufficient documentation

## 2021-06-27 DIAGNOSIS — M109 Gout, unspecified: Secondary | ICD-10-CM | POA: Insufficient documentation

## 2021-06-27 DIAGNOSIS — I10 Essential (primary) hypertension: Secondary | ICD-10-CM | POA: Diagnosis not present

## 2021-06-27 MED ORDER — LOSARTAN POTASSIUM 100 MG PO TABS: 100.0000 mg | ORAL_TABLET | Freq: Every day | ORAL | 1 refills | Status: DC

## 2021-06-27 NOTE — Progress Notes (Signed)
Assessment & Plan:  1. Essential hypertension - uncontrolled, but patient unwilling to add another medication - education provided on stroke prevention - encouraged to take BP at home and keep a log - encouraged healthy diet and exercise  - losartan (COZAAR) 100 MG tablet; Take 1 tablet (100 mg total) by mouth daily.  Dispense: 90 tablet; Refill: 1 - CBC with Differential/Platelet - CMP14+EGFR - Lipid panel  2. Mixed hyperlipidemia - patient declined initiating medication for cholesterol  - discussed potential implications of high cholesterol and stroke and heart attack risk - printed information about stroke prevention provided - Lipid panel  3. Controlled gout - well controlled on current regimen - patient states he is not having gout flares now and has stopped his allopurinol  - may take colchicine as needed if flare occurrs - Uric acid   Return in about 4 months (around 10/25/2021) for annual physical.  Lucile Crater, NP Student  I personally was present during the history, physical exam, and medical decision-making activities of this service and have verified that the service and findings are accurately documented in the nurse practitioner student's note.  Hendricks Limes, MSN, APRN, FNP-C [facility]   Subjective:    Patient ID: Christopher Fitzpatrick, male    DOB: 05/24/1966, 55 y.o.   MRN: [id number]  Patient Care Team: Loman Brooklyn, FNP as PCP - General (Family Medicine)   Chief Complaint:  Chief Complaint  Patient presents with   Hypertension    4 month follow up of chronic medical conditions     HPI: Christopher Fitzpatrick is a 55 y.o. male presenting on 06/27/2021 for Hypertension (4 month follow up of chronic medical conditions )   Hypertension: He is taking losartan 100 mg daily. He does not check his blood pressures regularly at home.   Hyperlipidemia: He is not taking medication for this.   The 10-year ASCVD risk score (Arnett DK, et al.,  2019) is: 17.5%   Values used to calculate the score:     Age: 47 years     Sex: Male     Is Non-Hispanic African American: No     Diabetic: No     Tobacco smoker: Yes     Systolic Blood Pressure: 858 mmHg     Is BP treated: Yes     HDL Cholesterol: 38 mg/dL     Total Cholesterol: 221 mg/dL  Gout: He states he has not had a gout flare since his knee surgery. He would like colchicine to continue to be available in case something occurs. He has not been taking his allopurinol.  New complaints: None  Social history:  Relevant past medical, surgical, family and social history reviewed and updated as indicated. Interim medical history since our last visit reviewed.  Allergies and medications reviewed and updated.  DATA REVIEWED: CHART IN EPIC  ROS: Negative unless specifically indicated above in HPI.    Current Outpatient Medications:    allopurinol (ZYLOPRIM) 300 MG tablet, Take 1 tablet (300 mg total) by mouth daily., Disp: 30 tablet, Rfl: 2   colchicine 0.6 MG tablet, Take 1.2 mg (2 tablets) by mouth at the first sign of a gout flare, then 0.6 mg (1 tablet) 1 hour later., Disp: 10 tablet, Rfl: 2   losartan (COZAAR) 100 MG tablet, Take 1 tablet (100 mg total) by mouth daily., Disp: 90 tablet, Rfl: 1   No Known Allergies Past Medical History:  Diagnosis Date   Arthritis    "  qwhere; knees, ankles, shoulders, hip" (12/03/2017)   Essential hypertension    Gout    Hyperlipidemia    Left leg cellulitis 12/02/2017    Past Surgical History:  Procedure Laterality Date   CARPAL TUNNEL RELEASE Right 11/25/2015   Procedure: CARPAL TUNNEL RELEASE;  Surgeon: Jovita Gamma, MD;  Location: [facility];  Service: Neurosurgery;  Laterality: Right;   CARPAL TUNNEL RELEASE Left 12/23/2015   Procedure: CARPAL TUNNEL RELEASE;  Surgeon: Jovita Gamma, MD;  Location: [facility];  Service: Neurosurgery;  Laterality: Left;  Left Carpal Tunnel Release   INGUINAL HERNIA REPAIR Right 1986    KNEE ARTHROSCOPY Right 1987; 1988; 1989; 1990; ~ 1993"   KNEE ARTHROSCOPY Left ~ 2014   KNEE RECONSTRUCTION Right 1985   REPLACEMENT TOTAL KNEE Right     Social History   Socioeconomic History   Marital status: Married    Spouse name: Not on file   Number of children: Not on file   Years of education: Not on file   Highest education level: Not on file  Occupational History   Occupation: Scientist, research (life sciences), owns vending company  Tobacco Use   Smoking status: Every Day    Types: Pipe    Last attempt to quit: 07/27/2015    Years since quitting: 5.9   Smokeless tobacco: Former    Types: Snuff, Sarina Ser    Quit date: 1988  Vaping Use   Vaping Use: Never used  Substance and Sexual Activity   Alcohol use: Yes    Comment: 12/03/2017 "no more than 1 beer q couple months"   Drug use: No   Sexual activity: Yes    Birth control/protection: None  Other Topics Concern   Not on file  Social History Narrative   Not on file   Social Determinants of Health   Financial Resource Strain: Not on file  Food Insecurity: Not on file  Transportation Needs: Not on file  Physical Activity: Not on file  Stress: Not on file  Social Connections: Not on file  Intimate Partner Violence: Not on file        Objective:    BP 134/82   Pulse 73   Temp (!) 97.4 F (36.3 C) (Temporal)   Ht 5' 10" (1.778 m)   Wt 124.1 kg   SpO2 97%   BMI 39.26 kg/m   Wt Readings from Last 3 Encounters:  06/27/21 273 lb 9.6 oz (124.1 kg)  01/17/21 263 lb 12.8 oz (119.7 kg)  10/11/20 287 lb (130.2 kg)    Physical Exam Vitals reviewed.  Constitutional:      General: He is not in acute distress.    Appearance: Normal appearance. He is obese. He is not ill-appearing, toxic-appearing or diaphoretic.  HENT:     Head: Normocephalic and atraumatic.  Eyes:     General: No scleral icterus.       Right eye: No discharge.        Left eye: No discharge.     Conjunctiva/sclera: Conjunctivae normal.  Cardiovascular:      Rate and Rhythm: Normal rate and regular rhythm.     Heart sounds: Normal heart sounds. No murmur heard.   No friction rub. No gallop.  Pulmonary:     Effort: Pulmonary effort is normal. No respiratory distress.     Breath sounds: Normal breath sounds. No stridor. No wheezing, rhonchi or rales.  Musculoskeletal:        General: Normal range of motion.  Cervical back: Normal range of motion.  Skin:    General: Skin is warm and dry.  Neurological:     Mental Status: He is alert and oriented to person, place, and time. Mental status is at baseline.  Psychiatric:        Mood and Affect: Mood normal.        Behavior: Behavior normal.        Thought Content: Thought content normal.        Judgment: Judgment normal.    No results found for: TSH Lab Results  Component Value Date   WBC 8.3 10/11/2020   HGB 16.1 10/11/2020   HCT 48.2 10/11/2020   MCV 87 10/11/2020   PLT 208 10/11/2020   Lab Results  Component Value Date   NA 140 10/11/2020   K 4.6 10/11/2020   CO2 22 10/11/2020   GLUCOSE 84 10/11/2020   BUN 28 (H) 10/11/2020   CREATININE 1.19 10/11/2020   BILITOT 0.8 10/11/2020   ALKPHOS 57 10/11/2020   AST 12 10/11/2020   ALT 19 10/11/2020   PROT 6.8 10/11/2020   ALBUMIN 4.8 10/11/2020   CALCIUM 10.0 10/11/2020   ANIONGAP 9 03/28/2018   Lab Results  Component Value Date   CHOL 221 (H) 10/11/2020   Lab Results  Component Value Date   HDL 38 (L) 10/11/2020   Lab Results  Component Value Date   LDLCALC 141 (H) 10/11/2020   Lab Results  Component Value Date   TRIG 230 (H) 10/11/2020   Lab Results  Component Value Date   CHOLHDL 5.8 (H) 10/11/2020   No results found for: HGBA1C

## 2021-06-28 LAB — CBC WITH DIFFERENTIAL/PLATELET
Basophils Absolute: 0.1 10*3/uL (ref 0.0–0.2)
Basos: 1 %
EOS (ABSOLUTE): 0.2 10*3/uL (ref 0.0–0.4)
Eos: 3 %
Hematocrit: 47 % (ref 37.5–51.0)
Hemoglobin: 15.7 g/dL (ref 13.0–17.7)
Immature Grans (Abs): 0 10*3/uL (ref 0.0–0.1)
Immature Granulocytes: 0 %
Lymphocytes Absolute: 1.8 10*3/uL (ref 0.7–3.1)
Lymphs: 25 %
MCH: 28.6 pg (ref 26.6–33.0)
MCHC: 33.4 g/dL (ref 31.5–35.7)
MCV: 86 fL (ref 79–97)
Monocytes Absolute: 0.4 10*3/uL (ref 0.1–0.9)
Monocytes: 6 %
Neutrophils Absolute: 4.7 10*3/uL (ref 1.4–7.0)
Neutrophils: 65 %
Platelets: 216 10*3/uL (ref 150–450)
RBC: 5.48 x10E6/uL (ref 4.14–5.80)
RDW: 12.7 % (ref 11.6–15.4)
WBC: 7.2 10*3/uL (ref 3.4–10.8)

## 2021-06-28 LAB — CMP14+EGFR
ALT: 14 IU/L (ref 0–44)
AST: 13 IU/L (ref 0–40)
Albumin/Globulin Ratio: 2.6 — ABNORMAL HIGH (ref 1.2–2.2)
Albumin: 4.6 g/dL (ref 3.8–4.9)
Alkaline Phosphatase: 56 IU/L (ref 44–121)
BUN/Creatinine Ratio: 20 (ref 9–20)
BUN: 22 mg/dL (ref 6–24)
Bilirubin Total: 0.5 mg/dL (ref 0.0–1.2)
CO2: 26 mmol/L (ref 20–29)
Calcium: 10 mg/dL (ref 8.7–10.2)
Chloride: 103 mmol/L (ref 96–106)
Creatinine, Ser: 1.1 mg/dL (ref 0.76–1.27)
Globulin, Total: 1.8 g/dL (ref 1.5–4.5)
Glucose: 89 mg/dL (ref 70–99)
Potassium: 4.9 mmol/L (ref 3.5–5.2)
Sodium: 140 mmol/L (ref 134–144)
Total Protein: 6.4 g/dL (ref 6.0–8.5)
eGFR: 80 mL/min/{1.73_m2} (ref >59)

## 2021-06-28 LAB — LIPID PANEL
Chol/HDL Ratio: 5.9 ratio — ABNORMAL HIGH (ref 0.0–5.0)
Cholesterol, Total: 229 mg/dL — ABNORMAL HIGH (ref 100–199)
HDL: 39 mg/dL — ABNORMAL LOW (ref >39)
LDL Chol Calc (NIH): 155 mg/dL — ABNORMAL HIGH (ref 0–99)
Triglycerides: 191 mg/dL — ABNORMAL HIGH (ref 0–149)
VLDL Cholesterol Cal: 35 mg/dL (ref 5–40)

## 2021-06-28 LAB — URIC ACID: Uric Acid: 7.5 mg/dL (ref 3.8–8.4)

## 2021-10-25 ENCOUNTER — Encounter: Payer: Self-pay | Admitting: Family Medicine

## 2021-10-25 ENCOUNTER — Ambulatory Visit (INDEPENDENT_AMBULATORY_CARE_PROVIDER_SITE_OTHER): Payer: BC Managed Care – PPO | Admitting: Family Medicine

## 2021-10-25 VITALS — BP 162/82 | HR 79 | Temp 97.5°F | Ht 70.0 in | Wt 275.4 lb

## 2021-10-25 DIAGNOSIS — E782 Mixed hyperlipidemia: Secondary | ICD-10-CM | POA: Diagnosis not present

## 2021-10-25 DIAGNOSIS — I10 Essential (primary) hypertension: Secondary | ICD-10-CM | POA: Diagnosis not present

## 2021-10-25 DIAGNOSIS — Z720 Tobacco use: Secondary | ICD-10-CM | POA: Diagnosis not present

## 2021-10-25 DIAGNOSIS — M109 Gout, unspecified: Secondary | ICD-10-CM

## 2021-10-25 MED ORDER — ROSUVASTATIN CALCIUM 5 MG PO TABS: 5.0000 mg | ORAL_TABLET | Freq: Every day | ORAL | 2 refills | Status: DC

## 2021-10-25 MED ORDER — HYDROCHLOROTHIAZIDE 12.5 MG PO CAPS: 12.5000 mg | ORAL_CAPSULE | Freq: Every day | ORAL | 2 refills | Status: DC

## 2021-10-25 NOTE — Progress Notes (Signed)
? ?Assessment & Plan:  ?1. Essential hypertension ?Uncontrolled. Started HCTZ 12.5 mg daily. ?- hydrochlorothiazide (MICROZIDE) 12.5 MG capsule; Take 1 capsule (12.5 mg total) by mouth daily.  Dispense: 30 capsule; Refill: 2 ? ?2. Mixed hyperlipidemia ?Uncontrolled. Started rosuvastatin 5 mg daily. Education provided on high cholesterol.  ?- rosuvastatin (CRESTOR) 5 MG tablet; Take 1 tablet (5 mg total) by mouth daily.  Dispense: 30 tablet; Refill: 2 ? ?3. Controlled gout ?Well controlled without medication. ? ?4. Tobacco use ?Encouraged smoking cessation.  ? ? ?Return in about 3 months (around 01/25/2022) for annual physical & f/u HTN, Lipids. ? ?Hendricks Limes, MSN, APRN, FNP-C ?[facility] ? ?Subjective:  ? ? Patient ID: Christopher Fitzpatrick, male    DOB: 01/08/1966, 56 y.o.   MRN: [id number] ? ?Patient Care Team: ?Loman Brooklyn, FNP as PCP - General (Family Medicine)  ? ?Chief Complaint:  ?Chief Complaint  ?Patient presents with  ? Medical Management of Chronic Issues  ? ? ?HPI: ?Christopher Fitzpatrick is a 56 y.o. male presenting on 10/25/2021 for Medical Management of Chronic Issues ? ?Hypertension: He is taking losartan 100 mg daily. He does not check his blood pressures regularly at home.  ? ?Hyperlipidemia: He is not taking medication for this.  ? ?The 10-year ASCVD risk score (Arnett DK, et al., 2019) is: 25% ?  Values used to calculate the score: ?    Age: 22 years ?    Sex: Male ?    Is Non-Hispanic African American: No ?    Diabetic: No ?    Tobacco smoker: Yes ?    Systolic Blood Pressure: 707 mmHg ?    Is BP treated: Yes ?    HDL Cholesterol: 39 mg/dL ?    Total Cholesterol: 229 mg/dL ? ?Gout: He states he has not had a gout flare since his knee surgery. He would like colchicine to continue to be available in case something occurs. No longer needing allopurinol.  ? ?New complaints: ?None ? ? ?Social history: ? ?Relevant past medical, surgical, family and social history reviewed and updated as  indicated. Interim medical history since our last visit reviewed. ? ?Allergies and medications reviewed and updated. ? ?DATA REVIEWED: CHART IN EPIC ? ?ROS: Negative unless specifically indicated above in HPI.  ? ? ?Current Outpatient Medications:  ?  colchicine 0.6 MG tablet, Take 1.2 mg (2 tablets) by mouth at the first sign of a gout flare, then 0.6 mg (1 tablet) 1 hour later., Disp: 10 tablet, Rfl: 2 ?  losartan (COZAAR) 100 MG tablet, Take 1 tablet (100 mg total) by mouth daily., Disp: 90 tablet, Rfl: 1  ? ?No Known Allergies ?Past Medical History:  ?Diagnosis Date  ? Arthritis   ? "qwhere; knees, ankles, shoulders, hip" (12/03/2017)  ? Essential hypertension   ? Gout   ? Hyperlipidemia   ? Left leg cellulitis 12/02/2017  ?  ?Past Surgical History:  ?Procedure Laterality Date  ? CARPAL TUNNEL RELEASE Right 11/25/2015  ? Procedure: CARPAL TUNNEL RELEASE;  Surgeon: Jovita Gamma, MD;  Location: [facility];  Service: Neurosurgery;  Laterality: Right;  ? CARPAL TUNNEL RELEASE Left 12/23/2015  ? Procedure: CARPAL TUNNEL RELEASE;  Surgeon: Jovita Gamma, MD;  Location: [facility];  Service: Neurosurgery;  Laterality: Left;  Left Carpal Tunnel Release  ? INGUINAL HERNIA REPAIR Right 1986  ? KNEE ARTHROSCOPY Right 1987; 1988; 1989; 1990; ~ 1993"  ? KNEE ARTHROSCOPY Left ~ 2014  ? KNEE RECONSTRUCTION  Right 1985  ? REPLACEMENT TOTAL KNEE Right   ?  ?Social History  ? ?Socioeconomic History  ? Marital status: Married  ?  Spouse name: Not on file  ? Number of children: Not on file  ? Years of education: Not on file  ? Highest education level: Not on file  ?Occupational History  ? Occupation: CAT operator, owns vending company  ?Tobacco Use  ? Smoking status: Every Day  ?  Types: Pipe  ?  Last attempt to quit: 07/27/2015  ?  Years since quitting: 6.2  ? Smokeless tobacco: Former  ?  Types: Snuff, Chew  ?  Quit date: 1988  ?Vaping Use  ? Vaping Use: Never used  ?Substance and Sexual Activity  ? Alcohol use: Yes  ?   Comment: 12/03/2017 "no more than 1 beer q couple months"  ? Drug use: No  ? Sexual activity: Yes  ?  Birth control/protection: None  ?Other Topics Concern  ? Not on file  ?Social History Narrative  ? Not on file  ? ?Social Determinants of Health  ? ?Financial Resource Strain: Not on file  ?Food Insecurity: Not on file  ?Transportation Needs: Not on file  ?Physical Activity: Not on file  ?Stress: Not on file  ?Social Connections: Not on file  ?Intimate Partner Violence: Not on file  ?  ? ?   ?Objective:  ?  ?BP (!) 162/82   Pulse 79   Temp (!) 97.5 ?F (36.4 ?C) (Temporal)   Ht 5' 10" (1.778 m)   Wt 275 lb 6.4 oz (124.9 kg)   SpO2 97%   BMI 39.52 kg/m?  ? ?Wt Readings from Last 3 Encounters:  ?10/25/21 275 lb 6.4 oz (124.9 kg)  ?06/27/21 273 lb 9.6 oz (124.1 kg)  ?01/17/21 263 lb 12.8 oz (119.7 kg)  ? ? ?Physical Exam ?Vitals reviewed.  ?Constitutional:   ?   General: He is not in acute distress. ?   Appearance: Normal appearance. He is obese. He is not ill-appearing, toxic-appearing or diaphoretic.  ?HENT:  ?   Head: Normocephalic and atraumatic.  ?Eyes:  ?   General: No scleral icterus.    ?   Right eye: No discharge.     ?   Left eye: No discharge.  ?   Conjunctiva/sclera: Conjunctivae normal.  ?Cardiovascular:  ?   Rate and Rhythm: Normal rate and regular rhythm.  ?   Heart sounds: Normal heart sounds. No murmur heard. ?  No friction rub. No gallop.  ?Pulmonary:  ?   Effort: Pulmonary effort is normal. No respiratory distress.  ?   Breath sounds: Normal breath sounds. No stridor. No wheezing, rhonchi or rales.  ?Musculoskeletal:     ?   General: Normal range of motion.  ?   Cervical back: Normal range of motion.  ?Skin: ?   General: Skin is warm and dry.  ?Neurological:  ?   Mental Status: He is alert and oriented to person, place, and time. Mental status is at baseline.  ?Psychiatric:     ?   Mood and Affect: Mood normal.     ?   Behavior: Behavior normal.     ?   Thought Content: Thought content normal.      ?   Judgment: Judgment normal.  ? ? ?No results found for: TSH ?Lab Results  ?Component Value Date  ? WBC 7.2 06/27/2021  ? HGB 15.7 06/27/2021  ? HCT 47.0 06/27/2021  ? MCV 86 06/27/2021  ?   PLT 216 06/27/2021  ? ?Lab Results  ?Component Value Date  ? NA 140 06/27/2021  ? K 4.9 06/27/2021  ? CO2 26 06/27/2021  ? GLUCOSE 89 06/27/2021  ? BUN 22 06/27/2021  ? CREATININE 1.10 06/27/2021  ? BILITOT 0.5 06/27/2021  ? ALKPHOS 56 06/27/2021  ? AST 13 06/27/2021  ? ALT 14 06/27/2021  ? PROT 6.4 06/27/2021  ? ALBUMIN 4.6 06/27/2021  ? CALCIUM 10.0 06/27/2021  ? ANIONGAP 9 03/28/2018  ? EGFR 80 06/27/2021  ? ?Lab Results  ?Component Value Date  ? CHOL 229 (H) 06/27/2021  ? ?Lab Results  ?Component Value Date  ? HDL 39 (L) 06/27/2021  ? ?Lab Results  ?Component Value Date  ? LDLCALC 155 (H) 06/27/2021  ? ?Lab Results  ?Component Value Date  ? TRIG 191 (H) 06/27/2021  ? ?Lab Results  ?Component Value Date  ? CHOLHDL 5.9 (H) 06/27/2021  ? ?No results found for: HGBA1C ? ?   ? ? ? ? ?

## 2021-11-13 ENCOUNTER — Other Ambulatory Visit: Payer: Self-pay | Admitting: Family Medicine

## 2021-11-13 DIAGNOSIS — I10 Essential (primary) hypertension: Secondary | ICD-10-CM

## 2021-12-25 ENCOUNTER — Other Ambulatory Visit: Payer: Self-pay | Admitting: Family Medicine

## 2021-12-25 DIAGNOSIS — E782 Mixed hyperlipidemia: Secondary | ICD-10-CM

## 2021-12-25 DIAGNOSIS — I10 Essential (primary) hypertension: Secondary | ICD-10-CM

## 2022-01-26 ENCOUNTER — Encounter: Payer: Self-pay | Admitting: Family Medicine

## 2022-01-26 ENCOUNTER — Ambulatory Visit (INDEPENDENT_AMBULATORY_CARE_PROVIDER_SITE_OTHER): Payer: BC Managed Care – PPO | Admitting: Family Medicine

## 2022-01-26 ENCOUNTER — Other Ambulatory Visit: Payer: Self-pay | Admitting: Family Medicine

## 2022-01-26 VITALS — BP 139/87 | HR 74 | Temp 97.6°F | Ht 70.0 in | Wt 275.6 lb

## 2022-01-26 DIAGNOSIS — E782 Mixed hyperlipidemia: Secondary | ICD-10-CM

## 2022-01-26 DIAGNOSIS — Z0000 Encounter for general adult medical examination without abnormal findings: Secondary | ICD-10-CM

## 2022-01-26 DIAGNOSIS — I10 Essential (primary) hypertension: Secondary | ICD-10-CM | POA: Diagnosis not present

## 2022-01-26 DIAGNOSIS — Z0001 Encounter for general adult medical examination with abnormal findings: Secondary | ICD-10-CM | POA: Diagnosis not present

## 2022-01-26 DIAGNOSIS — M1A0611 Idiopathic chronic gout, right knee, with tophus (tophi): Secondary | ICD-10-CM | POA: Diagnosis not present

## 2022-01-26 DIAGNOSIS — Z136 Encounter for screening for cardiovascular disorders: Secondary | ICD-10-CM | POA: Diagnosis not present

## 2022-01-26 MED ORDER — ROSUVASTATIN CALCIUM 5 MG PO TABS: 5.0000 mg | ORAL_TABLET | Freq: Every day | ORAL | 3 refills | Status: DC

## 2022-01-26 MED ORDER — COLCHICINE 0.6 MG PO TABS: ORAL_TABLET | ORAL | 2 refills | Status: DC

## 2022-01-26 MED ORDER — LOSARTAN POTASSIUM-HCTZ 100-12.5 MG PO TABS: 1.0000 | ORAL_TABLET | Freq: Every day | ORAL | 3 refills | Status: DC

## 2022-01-26 NOTE — Progress Notes (Signed)
Assessment & Plan:  Well adult exam Discussed health benefits of physical activity, and encouraged him to engage in regular exercise appropriate for his age and condition. Preventive health education provided. Patient declined COVID vaccines, Shingrix, Prevnar 20, and Hepatitis C screening.   Immunization History  Administered Date(s) Administered   Tdap 12/04/2017   Health Maintenance  Topic Date Due   COVID-19 Vaccine (1) 02/11/2022 (Originally 01/06/1967)   Zoster Vaccines- Shingrix (1 of 2) 04/28/2022 (Originally 07/08/2016)   Hepatitis C Screening  06/27/2022 (Originally 07/08/1984)   INFLUENZA VACCINE  03/20/2022   Fecal DNA (Cologuard)  03/18/2023   TETANUS/TDAP  12/05/2027   HIV Screening  Completed   HPV VACCINES  Aged Out   - CBC with Differential/Platelet - CMP14+EGFR - Lipid panel - PSA, total and free  2. Essential hypertension Well controlled on current regimen. Combined Losartan and HCTZ to decrease pill burden. - CBC with Differential/Platelet - CMP14+EGFR - Lipid panel - losartan-hydrochlorothiazide (HYZAAR) 100-12.5 MG tablet; Take 1 tablet by mouth daily.  Dispense: 90 tablet; Refill: 3  3. 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  - colchicine 0.6 MG tablet; Take 1.2 mg (2 tablets) by mouth at the first sign of a gout flare, then 0.6 mg (1 tablet) 1 hour later.  Dispense: 10 tablet; Refill: 2 - CMP14+EGFR  4. Mixed hyperlipidemia Labs to assess since starting rosuvastatin. - CBC with Differential/Platelet - CMP14+EGFR - Lipid panel - rosuvastatin (CRESTOR) 5 MG tablet; Take 1 tablet (5 mg total) by mouth daily.  Dispense: 90 tablet; Refill: 3   Follow-up: Return in about 1 year (around 01/27/2023) for annual physical.   Hendricks Limes, MSN, APRN, FNP-C [facility]  Subjective:  Patient ID: Christopher Fitzpatrick, male    DOB: [birth year]  Age: 56 y.o. MRN: [id number]  Patient Care  Team: Loman Brooklyn, FNP as PCP - General (Family Medicine)   CC:  Chief Complaint  Patient presents with   Annual Exam    HPI Christopher Fitzpatrick is a 56 y.o. male who presents today for a complete physical exam. He reports consuming a  healthy  diet. Home exercise routine includes stationary bike x20 minutes and weights x20 minutes. He generally feels well. He reports sleeping well. He does not have additional problems to discuss today.   Vision:Not within last year Dental:Receives regular dental care QPR:FFMBWGYK cancer screening and PSA options (with potential risks and benefits of testing vs. not testing) were discussed along with recent recs/guidelines.  and Agrees to PSA testing  DEPRESSION SCREENING    01/26/2022    9:17 AM 10/25/2021    8:30 AM 06/27/2021    8:42 AM 01/17/2021    3:58 PM 10/11/2020    8:09 AM 08/17/2020    8:05 AM 07/07/2020    8:08 AM  PHQ 2/9 Scores  PHQ - 2 Score 0 0 0 0 0 0 0  PHQ- 9 Score 0 0 0 0        Review of Systems  Constitutional:  Negative for chills, fever, malaise/fatigue and weight loss.  HENT:  Positive for hearing loss. Negative for congestion, ear discharge, ear pain, nosebleeds, sinus pain, sore throat and tinnitus.   Eyes:  Negative for blurred vision, double vision, pain, discharge and redness.  Respiratory:  Negative for cough, shortness of breath and wheezing.   Cardiovascular:  Negative for chest pain, palpitations and leg swelling.  Gastrointestinal:  Negative for abdominal pain,  constipation, diarrhea, heartburn, nausea and vomiting.  Genitourinary:  Negative for dysuria, frequency and urgency.       Denies trouble initiating a urine stream, weak stream, split stream, nocturia, and dribbling.   Musculoskeletal:  Negative for myalgias.  Skin:  Negative for rash.  Neurological:  Negative for dizziness, seizures, weakness and headaches.  Psychiatric/Behavioral:  Negative for depression, substance abuse and suicidal ideas. The patient  is not nervous/anxious.      Current Outpatient Medications:    colchicine 0.6 MG tablet, Take 1.2 mg (2 tablets) by mouth at the first sign of a gout flare, then 0.6 mg (1 tablet) 1 hour later., Disp: 10 tablet, Rfl: 2   hydrochlorothiazide (MICROZIDE) 12.5 MG capsule, TAKE ONE CAPSULE BY MOUTH DAILY, Disp: 30 capsule, Rfl: 0   losartan (COZAAR) 100 MG tablet, TAKE ONE (1) TABLET BY MOUTH EVERY DAY, Disp: 90 tablet, Rfl: 0   rosuvastatin (CRESTOR) 5 MG tablet, TAKE ONE (1) TABLET BY MOUTH EVERY DAY, Disp: 30 tablet, Rfl: 0  No Known Allergies  Past Medical History:  Diagnosis Date   Arthritis    "qwhere; knees, ankles, shoulders, hip" (12/03/2017)   Essential hypertension    Gout    Hyperlipidemia    Left leg cellulitis 12/02/2017    Past Surgical History:  Procedure Laterality Date   CARPAL TUNNEL RELEASE Right 11/25/2015   Procedure: CARPAL TUNNEL RELEASE;  Surgeon: Jovita Gamma, MD;  Location: [facility];  Service: Neurosurgery;  Laterality: Right;   CARPAL TUNNEL RELEASE Left 12/23/2015   Procedure: CARPAL TUNNEL RELEASE;  Surgeon: Jovita Gamma, MD;  Location: [facility];  Service: Neurosurgery;  Laterality: Left;  Left Carpal Tunnel Release   INGUINAL HERNIA REPAIR Right 1986   KNEE ARTHROSCOPY Right 1987; 1988; 1989; 1990; ~ 1993"   KNEE ARTHROSCOPY Left ~ 2014   KNEE RECONSTRUCTION Right 1985   REPLACEMENT TOTAL KNEE Right     Family History  Problem Relation Age of Onset   Multiple myeloma Mother    Kidney disease Mother    Diabetes Mother    Hypertension Mother    Heart attack Father    Heart disease Father    Hypertension Father    Hyperlipidemia Father    Alcohol abuse Sister    Bipolar disorder Sister    Diabetes Sister    Hypertension Sister    Thyroid disease Sister    Heart murmur Daughter    Lung cancer Paternal Grandmother     Social History   Socioeconomic History   Marital status: Married    Spouse name: Not on file   Number of  children: Not on file   Years of education: Not on file   Highest education level: Not on file  Occupational History   Occupation: Scientist, research (life sciences), owns vending company  Tobacco Use   Smoking status: Every Day    Types: Pipe    Last attempt to quit: 07/27/2015    Years since quitting: 6.5   Smokeless tobacco: Former    Types: Snuff, Sarina Ser    Quit date: 1988  Vaping Use   Vaping Use: Never used  Substance and Sexual Activity   Alcohol use: Yes    Comment: 12/03/2017 "no more than 1 beer q couple months"   Drug use: No   Sexual activity: Yes    Birth control/protection: None  Other Topics Concern   Not on file  Social History Narrative   Not on file   Social Determinants of Health  Financial Resource Strain: Not on file  Food Insecurity: Not on file  Transportation Needs: Not on file  Physical Activity: Not on file  Stress: Not on file  Social Connections: Not on file  Intimate Partner Violence: Not on file      Objective:    BP 139/87   Pulse 74   Temp 97.6 F (36.4 C) (Temporal)   Ht _0  (1.778 m)   Wt 275 lb 9.6 oz (125 kg)   SpO2 96%   BMI 39.54 kg/m   Wt Readings from Last 3 Encounters:  01/26/22 275 lb 9.6 oz (125 kg)  10/25/21 275 lb 6.4 oz (124.9 kg)  06/27/21 273 lb 9.6 oz (124.1 kg)    Physical Exam  No results found for: "TSH" Lab Results  Component Value Date   WBC 7.2 06/27/2021   HGB 15.7 06/27/2021   HCT 47.0 06/27/2021   MCV 86 06/27/2021   PLT 216 06/27/2021   Lab Results  Component Value Date   NA 140 06/27/2021   K 4.9 06/27/2021   CO2 26 06/27/2021   GLUCOSE 89 06/27/2021   BUN 22 06/27/2021   CREATININE 1.10 06/27/2021   BILITOT 0.5 06/27/2021   ALKPHOS 56 06/27/2021   AST 13 06/27/2021   ALT 14 06/27/2021   PROT 6.4 06/27/2021   ALBUMIN 4.6 06/27/2021   CALCIUM 10.0 06/27/2021   ANIONGAP 9 03/28/2018   EGFR 80 06/27/2021   Lab Results  Component Value Date   CHOL 229 (H) 06/27/2021   Lab Results  Component  Value Date   HDL 39 (L) 06/27/2021   Lab Results  Component Value Date   LDLCALC 155 (H) 06/27/2021   Lab Results  Component Value Date   TRIG 191 (H) 06/27/2021   Lab Results  Component Value Date   CHOLHDL 5.9 (H) 06/27/2021   No results found for: "HGBA1C"

## 2022-01-27 LAB — CBC WITH DIFFERENTIAL/PLATELET
Basophils Absolute: 0.1 10*3/uL (ref 0.0–0.2)
Basos: 1 %
EOS (ABSOLUTE): 0.2 10*3/uL (ref 0.0–0.4)
Eos: 3 %
Hematocrit: 48.4 % (ref 37.5–51.0)
Hemoglobin: 16.8 g/dL (ref 13.0–17.7)
Immature Grans (Abs): 0 10*3/uL (ref 0.0–0.1)
Immature Granulocytes: 0 %
Lymphocytes Absolute: 2 10*3/uL (ref 0.7–3.1)
Lymphs: 28 %
MCH: 29.9 pg (ref 26.6–33.0)
MCHC: 34.7 g/dL (ref 31.5–35.7)
MCV: 86 fL (ref 79–97)
Monocytes Absolute: 0.4 10*3/uL (ref 0.1–0.9)
Monocytes: 5 %
Neutrophils Absolute: 4.5 10*3/uL (ref 1.4–7.0)
Neutrophils: 63 %
Platelets: 197 10*3/uL (ref 150–450)
RBC: 5.61 x10E6/uL (ref 4.14–5.80)
RDW: 11.9 % (ref 11.6–15.4)
WBC: 7.1 10*3/uL (ref 3.4–10.8)

## 2022-01-27 LAB — CMP14+EGFR
ALT: 19 IU/L (ref 0–44)
AST: 15 IU/L (ref 0–40)
Albumin/Globulin Ratio: 2.2 (ref 1.2–2.2)
Albumin: 4.6 g/dL (ref 3.8–4.9)
Alkaline Phosphatase: 51 IU/L (ref 44–121)
BUN/Creatinine Ratio: 20 (ref 9–20)
BUN: 24 mg/dL (ref 6–24)
Bilirubin Total: 0.6 mg/dL (ref 0.0–1.2)
CO2: 24 mmol/L (ref 20–29)
Calcium: 10 mg/dL (ref 8.7–10.2)
Chloride: 103 mmol/L (ref 96–106)
Creatinine, Ser: 1.23 mg/dL (ref 0.76–1.27)
Globulin, Total: 2.1 g/dL (ref 1.5–4.5)
Glucose: 101 mg/dL — ABNORMAL HIGH (ref 70–99)
Potassium: 3.9 mmol/L (ref 3.5–5.2)
Sodium: 142 mmol/L (ref 134–144)
Total Protein: 6.7 g/dL (ref 6.0–8.5)
eGFR: 69 mL/min/{1.73_m2} (ref >59)

## 2022-01-27 LAB — LIPID PANEL
Chol/HDL Ratio: 4.7 ratio (ref 0.0–5.0)
Cholesterol, Total: 196 mg/dL (ref 100–199)
HDL: 42 mg/dL (ref >39)
LDL Chol Calc (NIH): 127 mg/dL — ABNORMAL HIGH (ref 0–99)
Triglycerides: 153 mg/dL — ABNORMAL HIGH (ref 0–149)
VLDL Cholesterol Cal: 27 mg/dL (ref 5–40)

## 2022-01-27 LAB — PSA: Prostate Specific Ag, Serum: 0.9 ng/mL (ref 0.0–4.0)

## 2022-01-29 ENCOUNTER — Other Ambulatory Visit: Payer: Self-pay | Admitting: Family Medicine

## 2022-01-29 DIAGNOSIS — E782 Mixed hyperlipidemia: Secondary | ICD-10-CM

## 2022-01-29 MED ORDER — ROSUVASTATIN CALCIUM 10 MG PO TABS: 10.0000 mg | ORAL_TABLET | Freq: Every day | ORAL | 1 refills | Status: DC

## 2022-02-06 ENCOUNTER — Encounter: Payer: Self-pay | Admitting: Family Medicine

## 2022-02-06 ENCOUNTER — Telehealth (INDEPENDENT_AMBULATORY_CARE_PROVIDER_SITE_OTHER): Payer: BC Managed Care – PPO | Admitting: Family Medicine

## 2022-02-06 DIAGNOSIS — L255 Unspecified contact dermatitis due to plants, except food: Secondary | ICD-10-CM

## 2022-02-06 MED ORDER — PREDNISONE 20 MG PO TABS: 40.0000 mg | ORAL_TABLET | Freq: Every day | ORAL | 0 refills | Status: AC

## 2022-02-06 NOTE — Progress Notes (Signed)
Virtual Visit via Video note  I connected with Christopher Fitzpatrick on 02/06/22 at 2:10 PM by video and verified that I am speaking with the correct person using two identifiers. Christopher Fitzpatrick is currently located at a hotel in [place] and his wife is currently with him during visit. The provider, Gwenlyn Fudge, FNP is located in their office at time of visit.  I discussed the limitations, risks, security and privacy concerns of performing an evaluation and management service by video and the availability of in person appointments. I also discussed with the patient that there may be a patient responsible charge related to this service. The patient expressed understanding and agreed to proceed.  Subjective: PCP: Gwenlyn Fudge, FNP  Chief Complaint  Patient presents with   Rash   Patient reports he has poison ivy all over that started a few days ago. He knows he was exposed when he was cutting up a tree that had fallen that was covered in it. He has been applying Clorox which is not helping.   ROS: Per HPI  Current Outpatient Medications:    colchicine 0.6 MG tablet, Take 1.2 mg (2 tablets) by mouth at the first sign of a gout flare, then 0.6 mg (1 tablet) 1 hour later., Disp: 10 tablet, Rfl: 2   losartan-hydrochlorothiazide (HYZAAR) 100-12.5 MG tablet, Take 1 tablet by mouth daily., Disp: 90 tablet, Rfl: 3   rosuvastatin (CRESTOR) 10 MG tablet, Take 1 tablet (10 mg total) by mouth daily., Disp: 90 tablet, Rfl: 1  No Known Allergies Past Medical History:  Diagnosis Date   Arthritis    "qwhere; knees, ankles, shoulders, hip" (12/03/2017)   Essential hypertension    Gout    Hyperlipidemia    Left leg cellulitis 12/02/2017    Observations/Objective: Physical Exam Vitals reviewed.  Constitutional:      General: He is not in acute distress.    Appearance: Normal appearance. He is not ill-appearing, toxic-appearing or diaphoretic.  HENT:     Head: Normocephalic and atraumatic.  Eyes:      General: No scleral icterus.       Right eye: No discharge.        Left eye: No discharge.     Conjunctiva/sclera: Conjunctivae normal.  Cardiovascular:     Rate and Rhythm: Normal rate.  Pulmonary:     Effort: Pulmonary effort is normal. No respiratory distress.  Musculoskeletal:        General: Normal range of motion.     Cervical back: Normal range of motion.  Skin:    General: Skin is warm and dry.     Findings: Rash present. Rash is vesicular (head, torso, arms, legs, and groin area.).  Neurological:     Mental Status: He is alert and oriented to person, place, and time. Mental status is at baseline.  Psychiatric:        Mood and Affect: Mood normal.        Behavior: Behavior normal.        Thought Content: Thought content normal.        Judgment: Judgment normal.    Assessment and Plan: 1. Rhus dermatitis Education provided on poison ivy. Encouraged use of cold compresses and antihistamine to help with itching. Recommended using Tecnu in the future when he is exposed.  - predniSONE (DELTASONE) 20 MG tablet; Take 2 tablets (40 mg total) by mouth daily with breakfast for 5 days.  Dispense: 10 tablet; Refill: 0  Follow Up Instructions:   I discussed the assessment and treatment plan with the patient. The patient was provided an opportunity to ask questions and all were answered. The patient agreed with the plan and demonstrated an understanding of the instructions.   The patient was advised to call back or seek an in-person evaluation if the symptoms worsen or if the condition fails to improve as anticipated.  The above assessment and management plan was discussed with the patient. The patient verbalized understanding of and has agreed to the management plan. Patient is aware to call the clinic if symptoms persist or worsen. Patient is aware when to return to the clinic for a follow-up visit. Patient educated on when it is appropriate to go to the emergency department.   Time  call ended: 2:21 PM  I provided 11 minutes of face-to-face time during this encounter.   Deliah Boston, MSN, APRN, FNP-C [facility] 02/06/22

## 2022-04-11 ENCOUNTER — Encounter: Payer: Self-pay | Admitting: *Deleted

## 2022-06-15 DIAGNOSIS — Z85828 Personal history of other malignant neoplasm of skin: Secondary | ICD-10-CM | POA: Diagnosis not present

## 2022-06-15 DIAGNOSIS — D225 Melanocytic nevi of trunk: Secondary | ICD-10-CM | POA: Diagnosis not present

## 2022-06-15 DIAGNOSIS — D2372 Other benign neoplasm of skin of left lower limb, including hip: Secondary | ICD-10-CM | POA: Diagnosis not present

## 2022-06-15 DIAGNOSIS — D2371 Other benign neoplasm of skin of right lower limb, including hip: Secondary | ICD-10-CM | POA: Diagnosis not present

## 2022-06-15 DIAGNOSIS — L720 Epidermal cyst: Secondary | ICD-10-CM | POA: Diagnosis not present

## 2022-06-15 DIAGNOSIS — L570 Actinic keratosis: Secondary | ICD-10-CM | POA: Diagnosis not present

## 2022-06-20 IMAGING — CR DG KNEE COMPLETE 4+V*R*
4 series · 4 of 4 positions shown · non-contrast
Comparison: February 24, 2020

CLINICAL DATA: Swelling and pain

EXAM:
RIGHT KNEE - COMPLETE 4+ VIEW

[t knee ap right]
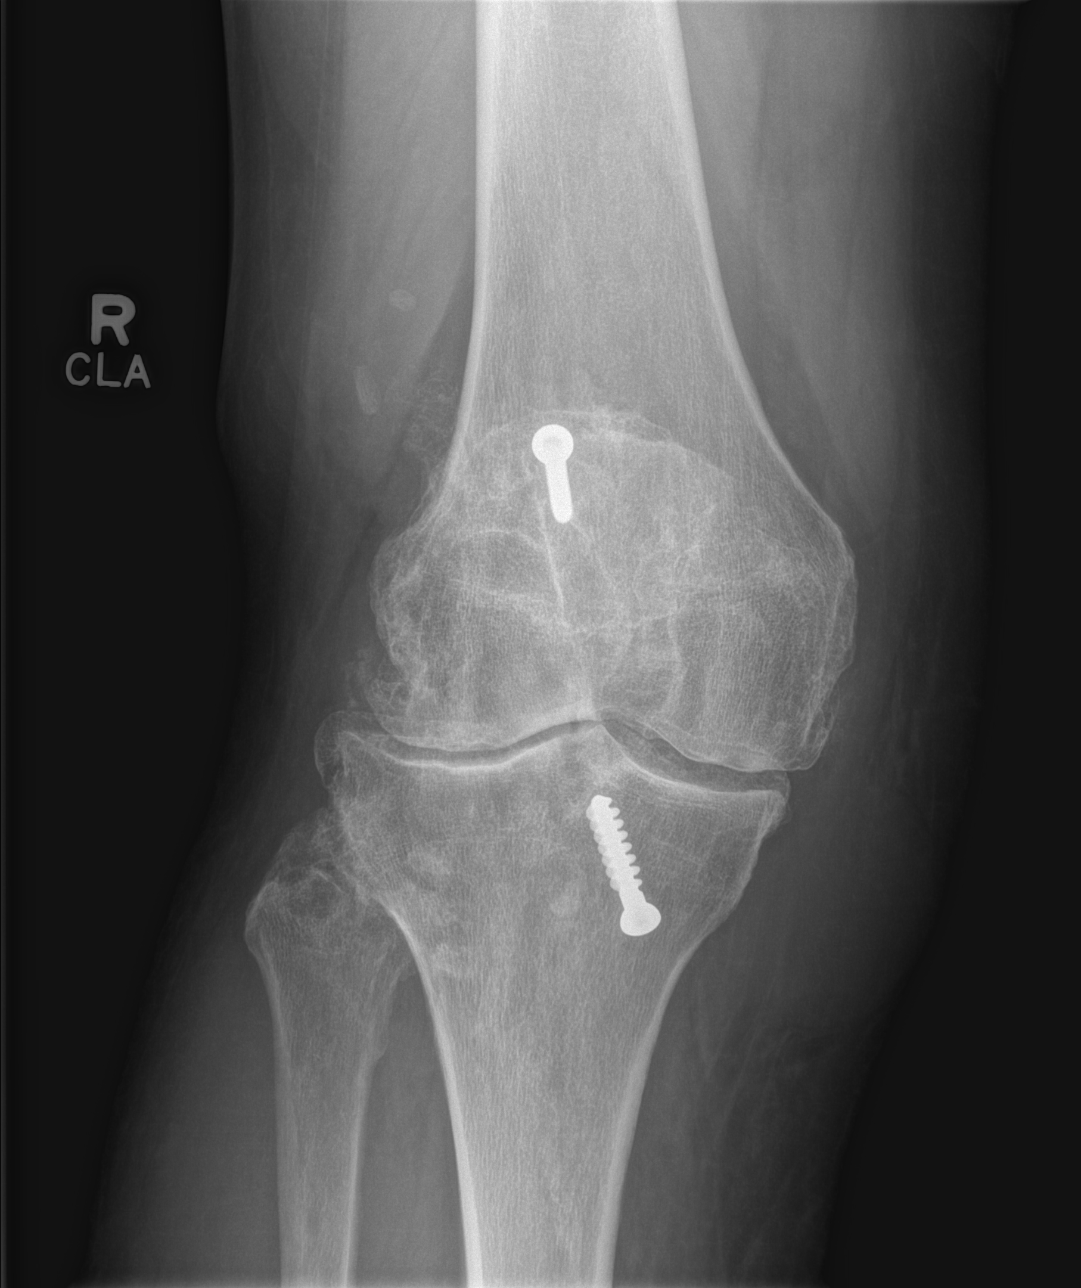

[t knee obl right (1 of 2)]
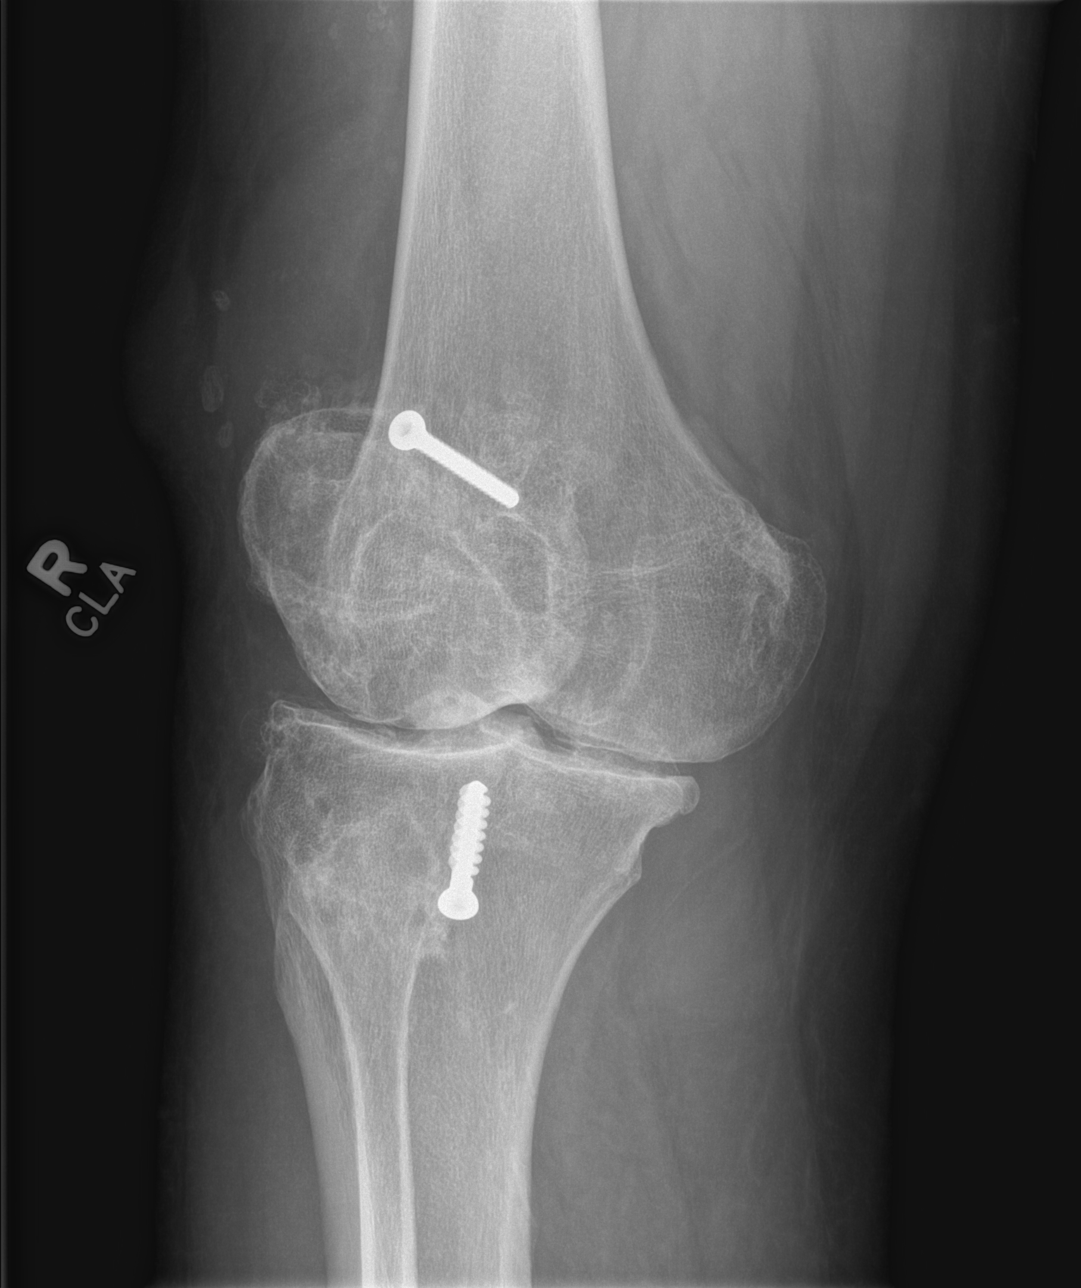

[t knee obl right (2 of 2)]
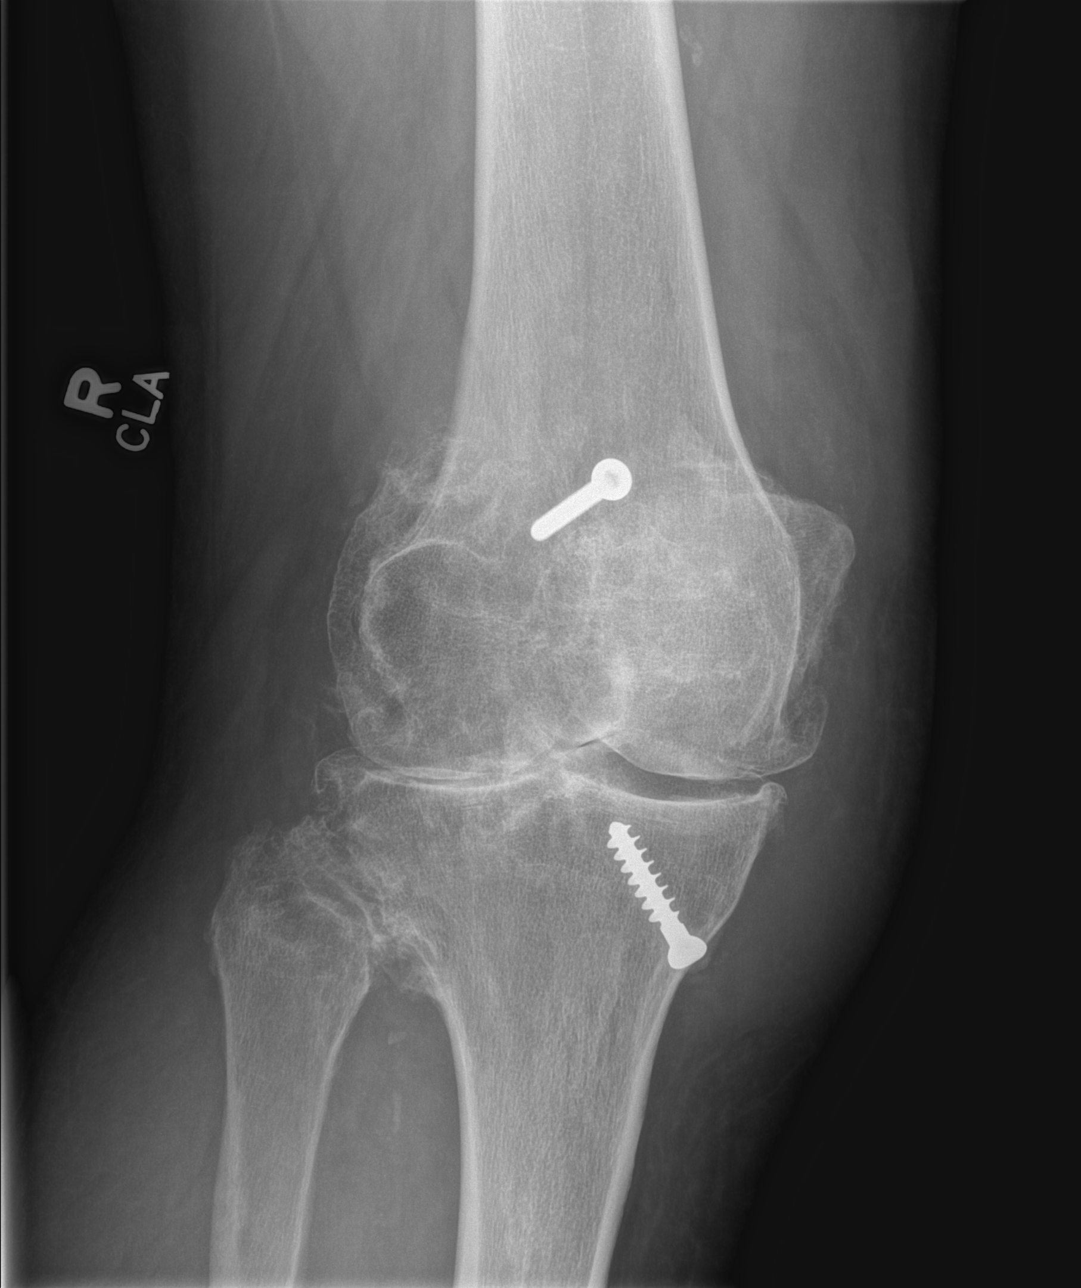

[t knee lat right]
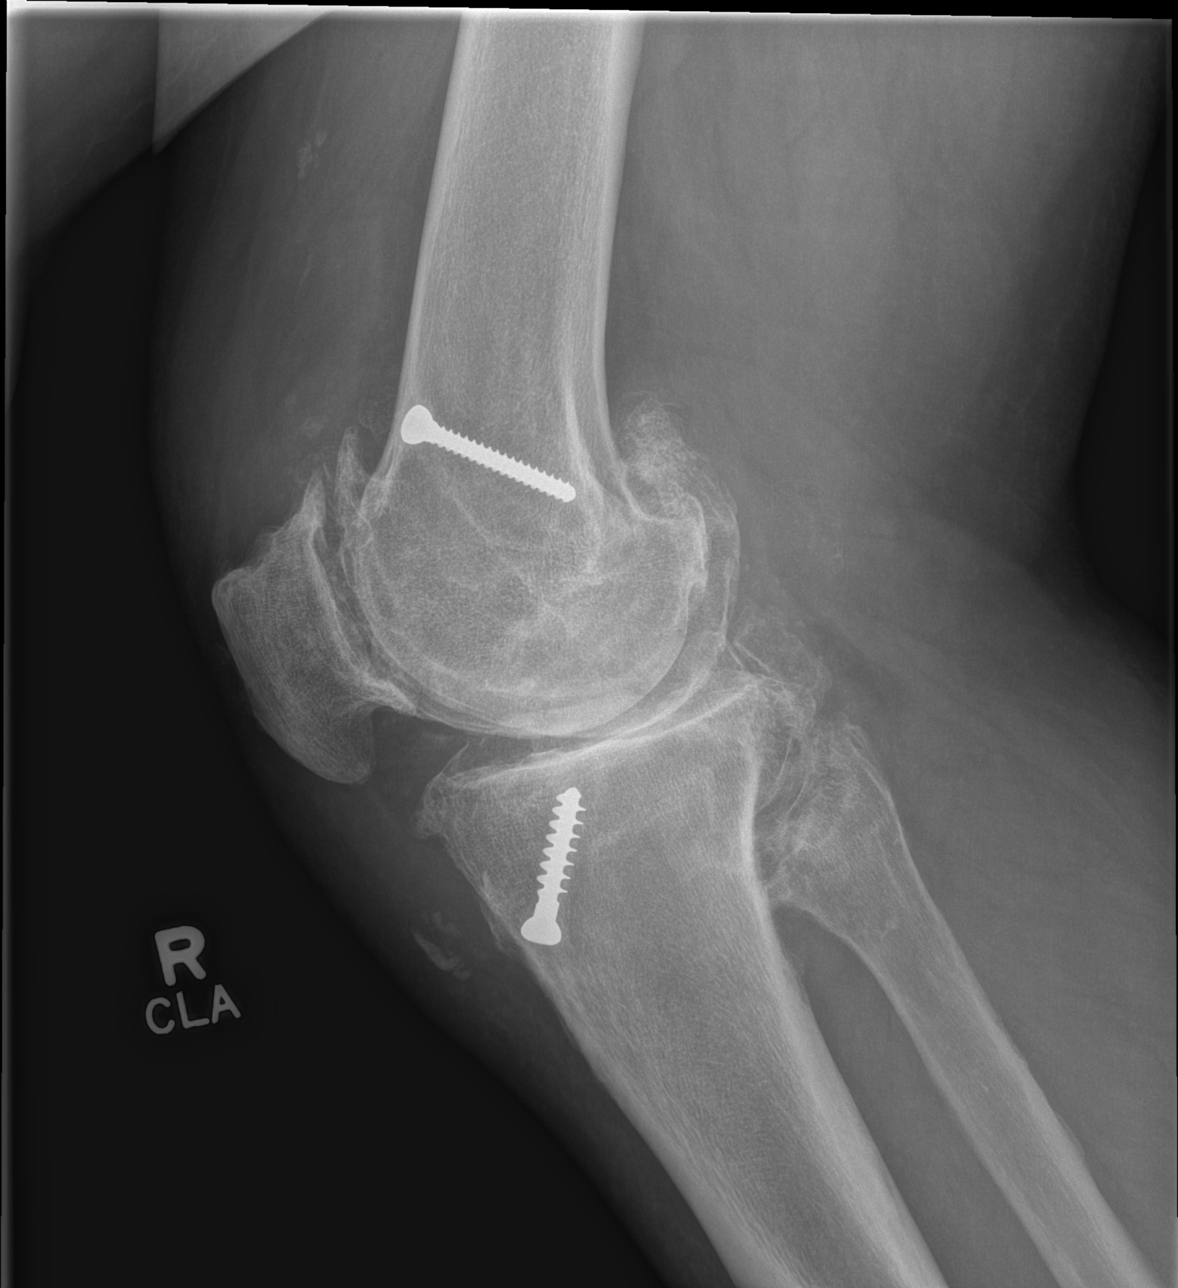

[4 of 4 positions shown; findings below may reference images not displayed]

FINDINGS: The patient appears to be status post prior ACL repair. There are
end-stage degenerative changes of the right knee. There is a
moderate-sized joint effusion is with debris. There is no acute
displaced fracture or dislocation. There is soft tissue swelling
about the knee.
IMPRESSION: 1. No acute displaced fracture or dislocation.
2. End-stage degenerative changes of the right knee.
3. Moderate-sized joint effusion with debris.

## 2022-07-30 ENCOUNTER — Other Ambulatory Visit: Payer: Self-pay | Admitting: Family Medicine

## 2022-07-30 DIAGNOSIS — E782 Mixed hyperlipidemia: Secondary | ICD-10-CM

## 2023-01-18 ENCOUNTER — Encounter: Payer: Self-pay | Admitting: Family Medicine

## 2023-01-18 ENCOUNTER — Ambulatory Visit (INDEPENDENT_AMBULATORY_CARE_PROVIDER_SITE_OTHER): Payer: BC Managed Care – PPO | Admitting: Family Medicine

## 2023-01-18 VITALS — BP 135/74 | HR 79 | Temp 97.6°F | Resp 20 | Ht 70.0 in | Wt 280.0 lb

## 2023-01-18 DIAGNOSIS — H6592 Unspecified nonsuppurative otitis media, left ear: Secondary | ICD-10-CM | POA: Diagnosis not present

## 2023-01-18 DIAGNOSIS — R42 Dizziness and giddiness: Secondary | ICD-10-CM

## 2023-01-18 MED ORDER — FLUTICASONE PROPIONATE 50 MCG/ACT NA SUSP: 2.0000 | Freq: Every day | NASAL | 6 refills | Status: DC

## 2023-01-18 NOTE — Progress Notes (Signed)
Subjective:  Patient ID: Christopher Fitzpatrick, male    DOB: [birth year], 57 y.o.   MRN: [id number]  No care team member to display   Chief Complaint:  Dizziness (Started Tuesday morning. BP was up. Has gotten better)   HPI: Christopher Fitzpatrick is a 57 y.o. male presenting on 01/18/2023 for Dizziness (Started Tuesday morning. BP was up. Has gotten better)   Pt presents today for evaluation of dizziness. States on Tuesday morning he woke up with dizziness, lasted for several hours and then resolved. Did have some diaphoresis when the dizziness was severe. No other associated symptoms. States dizziness was worse with position changes.   Dizziness This is a new problem. Episode onset: Tuesday. The problem has been resolved. Associated symptoms include diaphoresis. Pertinent negatives include no abdominal pain, anorexia, arthralgias, change in bowel habit, chest pain, chills, congestion, coughing, fatigue, fever, headaches, joint swelling, myalgias, nausea, neck pain, numbness, rash, sore throat, swollen glands, urinary symptoms, vertigo, visual change, vomiting or weakness. The symptoms are aggravated by twisting, standing and walking. He has tried nothing for the symptoms.    Relevant past medical, surgical, family, and social history reviewed and updated as indicated.  Allergies and medications reviewed and updated. Data reviewed: Chart in Epic.   Past Medical History:  Diagnosis Date   Arthritis    "qwhere; knees, ankles, shoulders, hip" (12/03/2017)   Essential hypertension    Gout    Hyperlipidemia    Left leg cellulitis 12/02/2017    Past Surgical History:  Procedure Laterality Date   CARPAL TUNNEL RELEASE Right 11/25/2015   Procedure: CARPAL TUNNEL RELEASE;  Surgeon: Shirlean Kelly, MD;  Location: [facility];  Service: Neurosurgery;  Laterality: Right;   CARPAL TUNNEL RELEASE Left 12/23/2015   Procedure: CARPAL TUNNEL RELEASE;  Surgeon: Shirlean Kelly, MD;  Location: [facility];   Service: Neurosurgery;  Laterality: Left;  Left Carpal Tunnel Release   INGUINAL HERNIA REPAIR Right 1986   KNEE ARTHROSCOPY Right 1987; 1988; 1989; 1990; ~ 1993"   KNEE ARTHROSCOPY Left ~ 2014   KNEE RECONSTRUCTION Right 1985   REPLACEMENT TOTAL KNEE Right     Social History   Socioeconomic History   Marital status: Married    Spouse name: Not on file   Number of children: Not on file   Years of education: Not on file   Highest education level: Not on file  Occupational History   Occupation: Advertising account executive, owns vending company  Tobacco Use   Smoking status: Every Day    Types: Pipe    Last attempt to quit: 07/27/2015    Years since quitting: 7.4   Smokeless tobacco: Former    Types: Snuff, Dorna Bloom    Quit date: 1988  Vaping Use   Vaping Use: Never used  Substance and Sexual Activity   Alcohol use: Yes    Comment: 12/03/2017 "no more than 1 beer q couple months"   Drug use: No   Sexual activity: Yes    Birth control/protection: None  Other Topics Concern   Not on file  Social History Narrative   Not on file   Social Determinants of Health   Financial Resource Strain: Not on file  Food Insecurity: Not on file  Transportation Needs: Not on file  Physical Activity: Not on file  Stress: Not on file  Social Connections: Not on file  Intimate Partner Violence: Not on file    Outpatient Encounter Medications as of 01/18/2023  Medication Sig  fluticasone (FLONASE) 50 MCG/ACT nasal spray Place 2 sprays into both nostrils daily.   losartan-hydrochlorothiazide (HYZAAR) 100-12.5 MG tablet Take 1 tablet by mouth daily.   rosuvastatin (CRESTOR) 10 MG tablet TAKE ONE (1) TABLET BY MOUTH EVERY DAY   [DISCONTINUED] colchicine 0.6 MG tablet Take 1.2 mg (2 tablets) by mouth at the first sign of a gout flare, then 0.6 mg (1 tablet) 1 hour later. (Patient not taking: Reported on 01/18/2023)   No facility-administered encounter medications on file as of 01/18/2023.    No Known  Allergies  Review of Systems  Constitutional:  Positive for diaphoresis. Negative for activity change, appetite change, chills, fatigue and fever.  HENT: Negative.  Negative for congestion and sore throat.   Eyes: Negative.   Respiratory:  Negative for cough, chest tightness and shortness of breath.   Cardiovascular:  Negative for chest pain, palpitations and leg swelling.  Gastrointestinal:  Negative for abdominal pain, anorexia, blood in stool, change in bowel habit, constipation, diarrhea, nausea and vomiting.  Endocrine: Negative.   Genitourinary:  Negative for dysuria, frequency and urgency.  Musculoskeletal:  Negative for arthralgias, joint swelling, myalgias and neck pain.  Skin: Negative.  Negative for rash.  Allergic/Immunologic: Negative.   Neurological:  Positive for dizziness (one episode on Tuesday, has resolved). Negative for vertigo, weakness, numbness and headaches.  Hematological: Negative.   Psychiatric/Behavioral:  Negative for confusion, hallucinations, sleep disturbance and suicidal ideas.   All other systems reviewed and are negative.       Objective:  BP 135/74   Pulse 79   Temp 97.6 F (36.4 C) (Temporal)   Resp 20   Ht 5\' 10"  (1.778 m)   Wt 280 lb (127 kg)   SpO2 95%   BMI 40.18 kg/m    Wt Readings from Last 3 Encounters:  01/18/23 280 lb (127 kg)  01/26/22 275 lb 9.6 oz (125 kg)  10/25/21 275 lb 6.4 oz (124.9 kg)    Physical Exam Vitals and nursing note reviewed.  Constitutional:      General: He is not in acute distress.    Appearance: Normal appearance. He is well-developed and well-groomed. He is obese. He is not ill-appearing, toxic-appearing or diaphoretic.  HENT:     Head: Normocephalic and atraumatic.     Jaw: There is normal jaw occlusion.     Right Ear: Hearing, tympanic membrane, ear canal and external ear normal.     Left Ear: Hearing normal. A middle ear effusion is present. Tympanic membrane is not erythematous.     Nose: Nose  normal.     Mouth/Throat:     Lips: Pink.     Mouth: Mucous membranes are moist.     Pharynx: Oropharynx is clear. Uvula midline.  Eyes:     General: Lids are normal.     Extraocular Movements: Extraocular movements intact.     Conjunctiva/sclera: Conjunctivae normal.     Pupils: Pupils are equal, round, and reactive to light.  Neck:     Thyroid: No thyroid mass, thyromegaly or thyroid tenderness.     Vascular: No carotid bruit or JVD.     Trachea: Trachea and phonation normal.  Cardiovascular:     Rate and Rhythm: Normal rate and regular rhythm.     Chest Wall: PMI is not displaced.     Pulses: Normal pulses.     Heart sounds: Normal heart sounds. No murmur heard.    No friction rub. No gallop.  Pulmonary:  Effort: Pulmonary effort is normal. No respiratory distress.     Breath sounds: Normal breath sounds. No wheezing.  Abdominal:     General: There is no abdominal bruit.     Palpations: There is no hepatomegaly or splenomegaly.  Musculoskeletal:     Cervical back: Normal range of motion and neck supple.     Right lower leg: No edema.     Left lower leg: No edema.  Lymphadenopathy:     Cervical: No cervical adenopathy.  Skin:    General: Skin is warm and dry.     Capillary Refill: Capillary refill takes less than 2 seconds.     Coloration: Skin is not cyanotic, jaundiced or pale.     Findings: No rash.  Neurological:     General: No focal deficit present.     Mental Status: He is alert and oriented to person, place, and time.     Sensory: Sensation is intact.     Motor: Motor function is intact.     Coordination: Coordination is intact.     Gait: Gait is intact.     Deep Tendon Reflexes: Reflexes are normal and symmetric.  Psychiatric:        Attention and Perception: Attention and perception normal.        Mood and Affect: Mood and affect normal.        Speech: Speech normal.        Behavior: Behavior normal. Behavior is cooperative.        Thought Content:  Thought content normal.        Cognition and Memory: Cognition and memory normal.        Judgment: Judgment normal.     Results for orders placed or performed in visit on 01/26/22  CMP14+EGFR  Result Value Ref Range   Glucose 101 (H) 70 - 99 mg/dL   BUN 24 6 - 24 mg/dL   Creatinine, Ser 0.86 0.76 - 1.27 mg/dL   eGFR 69 >57 QI/ONG/2.95   BUN/Creatinine Ratio 20 9 - 20   Sodium 142 134 - 144 mmol/L   Potassium 3.9 3.5 - 5.2 mmol/L   Chloride 103 96 - 106 mmol/L   CO2 24 20 - 29 mmol/L   Calcium 10.0 8.7 - 10.2 mg/dL   Total Protein 6.7 6.0 - 8.5 g/dL   Albumin 4.6 3.8 - 4.9 g/dL   Globulin, Total 2.1 1.5 - 4.5 g/dL   Albumin/Globulin Ratio 2.2 1.2 - 2.2   Bilirubin Total 0.6 0.0 - 1.2 mg/dL   Alkaline Phosphatase 51 44 - 121 IU/L   AST 15 0 - 40 IU/L   ALT 19 0 - 44 IU/L  CBC with Differential/Platelet  Result Value Ref Range   WBC 7.1 3.4 - 10.8 x10E3/uL   RBC 5.61 4.14 - 5.80 x10E6/uL   Hemoglobin 16.8 13.0 - 17.7 g/dL   Hematocrit 28.4 13.2 - 51.0 %   MCV 86 79 - 97 fL   MCH 29.9 26.6 - 33.0 pg   MCHC 34.7 31.5 - 35.7 g/dL   RDW 44.0 10.2 - 72.5 %   Platelets 197 150 - 450 x10E3/uL   Neutrophils 63 Not Estab. %   Lymphs 28 Not Estab. %   Monocytes 5 Not Estab. %   Eos 3 Not Estab. %   Basos 1 Not Estab. %   Neutrophils Absolute 4.5 1.4 - 7.0 x10E3/uL   Lymphocytes Absolute 2.0 0.7 - 3.1 x10E3/uL   Monocytes Absolute 0.4 0.1 -  0.9 x10E3/uL   EOS (ABSOLUTE) 0.2 0.0 - 0.4 x10E3/uL   Basophils Absolute 0.1 0.0 - 0.2 x10E3/uL   Immature Granulocytes 0 Not Estab. %   Immature Grans (Abs) 0.0 0.0 - 0.1 x10E3/uL  Lipid panel  Result Value Ref Range   Cholesterol, Total 196 100 - 199 mg/dL   Triglycerides 409 (H) 0 - 149 mg/dL   HDL 42 >81 mg/dL   VLDL Cholesterol Cal 27 5 - 40 mg/dL   LDL Chol Calc (NIH) 191 (H) 0 - 99 mg/dL   Chol/HDL Ratio 4.7 0.0 - 5.0 ratio  PSA  Result Value Ref Range   Prostate Specific Ag, Serum 0.9 0.0 - 4.0 ng/mL       Pertinent labs  & imaging results that were available during my care of the patient were reviewed by me and considered in my medical decision making.  Assessment & Plan:  Jadarian was seen today for dizziness.  Diagnoses and all orders for this visit:  Dizziness Resolved. Discussed return precautions  Fluid level behind tympanic membrane of left ear Will treat with below. Aware to report new, worsening, or persistent symptoms.  -     fluticasone (FLONASE) 50 MCG/ACT nasal spray; Place 2 sprays into both nostrils daily.     Continue all other maintenance medications.  Follow up plan: Return if symptoms worsen or fail to improve.   Continue healthy lifestyle choices, including diet (rich in fruits, vegetables, and lean proteins, and low in salt and simple carbohydrates) and exercise (at least 30 minutes of moderate physical activity daily).  Educational handout given for dizziness, mid ear effusion   The above assessment and management plan was discussed with the patient. The patient verbalized understanding of and has agreed to the management plan. Patient is aware to call the clinic if they develop any new symptoms or if symptoms persist or worsen. Patient is aware when to return to the clinic for a follow-up visit. Patient educated on when it is appropriate to go to the emergency department.   Kari Baars, FNP-C [facility] [phone]

## 2023-01-30 ENCOUNTER — Ambulatory Visit (INDEPENDENT_AMBULATORY_CARE_PROVIDER_SITE_OTHER): Payer: BC Managed Care – PPO | Admitting: Family Medicine

## 2023-01-30 ENCOUNTER — Encounter: Payer: Self-pay | Admitting: Family Medicine

## 2023-01-30 VITALS — BP 127/82 | HR 78 | Temp 95.2°F | Ht 70.0 in | Wt 278.2 lb

## 2023-01-30 DIAGNOSIS — Z0001 Encounter for general adult medical examination with abnormal findings: Secondary | ICD-10-CM

## 2023-01-30 DIAGNOSIS — I10 Essential (primary) hypertension: Secondary | ICD-10-CM | POA: Diagnosis not present

## 2023-01-30 DIAGNOSIS — Z125 Encounter for screening for malignant neoplasm of prostate: Secondary | ICD-10-CM

## 2023-01-30 DIAGNOSIS — Z6838 Body mass index (BMI) 38.0-38.9, adult: Secondary | ICD-10-CM

## 2023-01-30 DIAGNOSIS — Z0000 Encounter for general adult medical examination without abnormal findings: Secondary | ICD-10-CM | POA: Diagnosis not present

## 2023-01-30 DIAGNOSIS — Z1211 Encounter for screening for malignant neoplasm of colon: Secondary | ICD-10-CM | POA: Diagnosis not present

## 2023-01-30 DIAGNOSIS — Z1212 Encounter for screening for malignant neoplasm of rectum: Secondary | ICD-10-CM

## 2023-01-30 DIAGNOSIS — E782 Mixed hyperlipidemia: Secondary | ICD-10-CM

## 2023-01-30 DIAGNOSIS — R7309 Other abnormal glucose: Secondary | ICD-10-CM | POA: Diagnosis not present

## 2023-01-30 LAB — BAYER DCA HB A1C WAIVED: HB A1C (BAYER DCA - WAIVED): 5.1 % (ref 4.8–5.6)

## 2023-01-30 MED ORDER — LOSARTAN POTASSIUM-HCTZ 100-12.5 MG PO TABS: 1.0000 | ORAL_TABLET | Freq: Every day | ORAL | 3 refills | Status: DC

## 2023-01-30 MED ORDER — ROSUVASTATIN CALCIUM 10 MG PO TABS: 10.0000 mg | ORAL_TABLET | Freq: Every day | ORAL | 3 refills | Status: DC

## 2023-01-30 NOTE — Patient Instructions (Signed)
CO Q10

## 2023-01-30 NOTE — Progress Notes (Signed)
Complete physical exam  Patient: Christopher Fitzpatrick   DOB: [birth year]   57 y.o. Male  MRN: [id number]  Subjective:    Chief Complaint  Patient presents with   Establish Care    Previous [facility] patient    Annual Exam    Christopher Fitzpatrick is a 57 y.o. male who presents today for a complete physical exam. He reports consuming a general diet. The patient has a physically strenuous job, but has no regular exercise apart from work.  He generally feels well. He reports sleeping well. He does not have additional problems to discuss today.    Most recent fall risk assessment:    01/30/2023    8:24 AM  Fall Risk   Falls in the past year? 0     Most recent depression screenings:    01/30/2023    8:24 AM 01/18/2023    8:04 AM  PHQ 2/9 Scores  PHQ - 2 Score 0 0  PHQ- 9 Score 0 1    Vision:Not within last year , Dental: No current dental problems and No regular dental care , and PSA: Agrees to PSA testing  Patient Active Problem List   Diagnosis Date Noted   Tobacco use 10/25/2021   Mixed hyperlipidemia 06/27/2021   Controlled gout 06/27/2021   Total knee replacement status, right 02/13/2021   Essential hypertension 02/24/2020   Chronic midline low back pain 02/24/2020   Past Medical History:  Diagnosis Date   Arthritis    "qwhere; knees, ankles, shoulders, hip" (12/03/2017)   Essential hypertension    Gout    Hyperlipidemia    Left leg cellulitis 12/02/2017   Past Surgical History:  Procedure Laterality Date   CARPAL TUNNEL RELEASE Right 11/25/2015   Procedure: CARPAL TUNNEL RELEASE;  Surgeon: Shirlean Kelly, MD;  Location: [facility];  Service: Neurosurgery;  Laterality: Right;   CARPAL TUNNEL RELEASE Left 12/23/2015   Procedure: CARPAL TUNNEL RELEASE;  Surgeon: Shirlean Kelly, MD;  Location: [facility];  Service: Neurosurgery;  Laterality: Left;  Left Carpal Tunnel Release   INGUINAL HERNIA REPAIR Right 1986   KNEE ARTHROSCOPY Right 1987; 1988; 1989; 1990; ~ 1993"   KNEE  ARTHROSCOPY Left ~ 2014   KNEE RECONSTRUCTION Right 1985   REPLACEMENT TOTAL KNEE Right    Social History   Tobacco Use   Smoking status: Every Day    Types: Pipe   Smokeless tobacco: Former    Types: Snuff, Chew    Quit date: 1988  Vaping Use   Vaping Use: Never used  Substance Use Topics   Alcohol use: Yes    Comment: 12/03/2017 "no more than 1 beer q couple months"   Drug use: No   Social History   Socioeconomic History   Marital status: Married    Spouse name: Not on file   Number of children: Not on file   Years of education: Not on file   Highest education level: Not on file  Occupational History   Occupation: Advertising account executive, owns vending company  Tobacco Use   Smoking status: Every Day    Types: Pipe   Smokeless tobacco: Former    Types: Snuff, Dorna Bloom    Quit date: 1988  Vaping Use   Vaping Use: Never used  Substance and Sexual Activity   Alcohol use: Yes    Comment: 12/03/2017 "no more than 1 beer q couple months"   Drug use: No   Sexual activity: Yes    Birth control/protection:  None  Other Topics Concern   Not on file  Social History Narrative   Not on file   Social Determinants of Health   Financial Resource Strain: Not on file  Food Insecurity: Not on file  Transportation Needs: Not on file  Physical Activity: Not on file  Stress: Not on file  Social Connections: Not on file  Intimate Partner Violence: Not on file   Family Status  Relation Name Status   Mother  Deceased   Father  Deceased   Sister 2 Alive   Brother  Alive   Daughter 2 Alive   MGM  Deceased   MGF  Deceased   PGM  Deceased   PGF  Deceased   Family History  Problem Relation Age of Onset   Multiple myeloma Mother    Kidney disease Mother    Diabetes Mother    Hypertension Mother    Heart attack Father    Heart disease Father    Hypertension Father    Hyperlipidemia Father    Alcohol abuse Sister    Bipolar disorder Sister    Diabetes Sister    Hypertension Sister     Thyroid disease Sister    Heart murmur Daughter    Lung cancer Paternal Grandmother    No Known Allergies    Patient Care Team: Alaster Asfaw, Doralee Albino, FNP as PCP - General (Family Medicine)   Outpatient Medications Prior to Visit  Medication Sig   fluticasone (FLONASE) 50 MCG/ACT nasal spray Place 2 sprays into both nostrils daily.   [DISCONTINUED] losartan-hydrochlorothiazide (HYZAAR) 100-12.5 MG tablet Take 1 tablet by mouth daily.   [DISCONTINUED] rosuvastatin (CRESTOR) 10 MG tablet TAKE ONE (1) TABLET BY MOUTH EVERY DAY   No facility-administered medications prior to visit.    Review of Systems  Musculoskeletal:  Positive for myalgias.  All other systems reviewed and are negative.         Objective:     BP 127/82   Pulse 78   Temp (!) 95.2 F (35.1 C) (Temporal)   Ht 5\' 10"  (1.778 m)   Wt 278 lb 3.2 oz (126.2 kg)   SpO2 91%   BMI 39.92 kg/m  BP Readings from Last 3 Encounters:  01/30/23 127/82  01/18/23 135/74  01/26/22 139/87   Wt Readings from Last 3 Encounters:  01/30/23 278 lb 3.2 oz (126.2 kg)  01/18/23 280 lb (127 kg)  01/26/22 275 lb 9.6 oz (125 kg)      Physical Exam Vitals and nursing note reviewed.  Constitutional:      General: He is not in acute distress.    Appearance: Normal appearance. He is well-developed and well-groomed. He is obese. He is not ill-appearing, toxic-appearing or diaphoretic.  HENT:     Head: Normocephalic and atraumatic.     Jaw: There is normal jaw occlusion.     Right Ear: Hearing, tympanic membrane, ear canal and external ear normal.     Left Ear: Hearing, tympanic membrane, ear canal and external ear normal.     Nose: Nose normal.     Mouth/Throat:     Lips: Pink.     Mouth: Mucous membranes are moist.     Pharynx: Oropharynx is clear. Uvula midline.  Eyes:     General: Lids are normal.     Extraocular Movements: Extraocular movements intact.     Conjunctiva/sclera: Conjunctivae normal.     Pupils: Pupils are  equal, round, and reactive to light.  Neck:  Thyroid: No thyroid mass, thyromegaly or thyroid tenderness.     Vascular: No carotid bruit or JVD.     Trachea: Trachea and phonation normal.  Cardiovascular:     Rate and Rhythm: Normal rate and regular rhythm.     Chest Wall: PMI is not displaced.     Pulses: Normal pulses.     Heart sounds: Normal heart sounds. No murmur heard.    No friction rub. No gallop.  Pulmonary:     Effort: Pulmonary effort is normal. No respiratory distress.     Breath sounds: Normal breath sounds. No wheezing.  Abdominal:     General: Bowel sounds are normal. There is no distension or abdominal bruit.     Palpations: Abdomen is soft. There is no hepatomegaly or splenomegaly.     Tenderness: There is no abdominal tenderness. There is no right CVA tenderness or left CVA tenderness.     Hernia: No hernia is present.  Musculoskeletal:        General: Normal range of motion.     Cervical back: Normal range of motion and neck supple.     Right lower leg: No edema.     Left lower leg: No edema.  Lymphadenopathy:     Cervical: No cervical adenopathy.  Skin:    General: Skin is warm and dry.     Capillary Refill: Capillary refill takes less than 2 seconds.     Coloration: Skin is not cyanotic, jaundiced or pale.     Findings: No rash.  Neurological:     General: No focal deficit present.     Mental Status: He is alert and oriented to person, place, and time.     Sensory: Sensation is intact.     Motor: Motor function is intact.     Coordination: Coordination is intact.     Gait: Gait is intact.     Deep Tendon Reflexes: Reflexes are normal and symmetric.  Psychiatric:        Attention and Perception: Attention and perception normal.        Mood and Affect: Mood and affect normal.        Speech: Speech normal.        Behavior: Behavior normal. Behavior is cooperative.        Thought Content: Thought content normal.        Cognition and Memory: Cognition  and memory normal.        Judgment: Judgment normal.       Last CBC Lab Results  Component Value Date   WBC 7.1 01/26/2022   HGB 16.8 01/26/2022   HCT 48.4 01/26/2022   MCV 86 01/26/2022   MCH 29.9 01/26/2022   RDW 11.9 01/26/2022   PLT 197 01/26/2022   Last metabolic panel Lab Results  Component Value Date   GLUCOSE 101 (H) 01/26/2022   NA 142 01/26/2022   K 3.9 01/26/2022   CL 103 01/26/2022   CO2 24 01/26/2022   BUN 24 01/26/2022   CREATININE 1.23 01/26/2022   EGFR 69 01/26/2022   CALCIUM 10.0 01/26/2022   PROT 6.7 01/26/2022   ALBUMIN 4.6 01/26/2022   LABGLOB 2.1 01/26/2022   AGRATIO 2.2 01/26/2022   BILITOT 0.6 01/26/2022   ALKPHOS 51 01/26/2022   AST 15 01/26/2022   ALT 19 01/26/2022   ANIONGAP 9 03/28/2018   Last lipids Lab Results  Component Value Date   CHOL 196 01/26/2022   HDL 42 01/26/2022   LDLCALC 127 (H)  01/26/2022   TRIG 153 (H) 01/26/2022   CHOLHDL 4.7 01/26/2022        Assessment & Plan:    Routine Health Maintenance and Physical Exam  Immunization History  Administered Date(s) Administered   Tdap 12/04/2017    Health Maintenance  Topic Date Due   COVID-19 Vaccine (1) 02/03/2023 (Originally 01/06/1967)   Zoster Vaccines- Shingrix (1 of 2) 05/02/2023 (Originally 07/08/2016)   Hepatitis C Screening  01/30/2024 (Originally 07/08/1984)   Fecal DNA (Cologuard)  03/18/2023   INFLUENZA VACCINE  03/21/2023   DTaP/Tdap/Td (2 - Td or Tdap) 12/05/2027   HIV Screening  Completed   HPV VACCINES  Aged Out    Discussed health benefits of physical activity, and encouraged him to engage in regular exercise appropriate for his age and condition.  Problem List Items Addressed This Visit       Cardiovascular and Mediastinum   Essential hypertension   Relevant Medications   losartan-hydrochlorothiazide (HYZAAR) 100-12.5 MG tablet   rosuvastatin (CRESTOR) 10 MG tablet     Other   Mixed hyperlipidemia   Relevant Medications    losartan-hydrochlorothiazide (HYZAAR) 100-12.5 MG tablet   rosuvastatin (CRESTOR) 10 MG tablet   Other Visit Diagnoses     Annual physical exam    -  Primary   Relevant Orders   CBC with Differential/Platelet   CMP14+EGFR   Lipid panel   Thyroid Panel With TSH   PSA, total and free   Cologuard   Bayer DCA Hb A1c Waived   Screening for prostate cancer       Screening for colorectal cancer       Relevant Orders   Cologuard      Return in about 1 year (around 01/30/2024) for CPE.     Kari Baars, FNP

## 2023-01-31 LAB — CBC WITH DIFFERENTIAL/PLATELET
Basophils Absolute: 0.1 10*3/uL (ref 0.0–0.2)
Basos: 1 %
EOS (ABSOLUTE): 0.1 10*3/uL (ref 0.0–0.4)
Eos: 2 %
Hematocrit: 48.1 % (ref 37.5–51.0)
Hemoglobin: 16.3 g/dL (ref 13.0–17.7)
Immature Grans (Abs): 0 10*3/uL (ref 0.0–0.1)
Immature Granulocytes: 0 %
Lymphocytes Absolute: 1.7 10*3/uL (ref 0.7–3.1)
Lymphs: 23 %
MCH: 29.4 pg (ref 26.6–33.0)
MCHC: 33.9 g/dL (ref 31.5–35.7)
MCV: 87 fL (ref 79–97)
Monocytes Absolute: 0.5 10*3/uL (ref 0.1–0.9)
Monocytes: 6 %
Neutrophils Absolute: 5.1 10*3/uL (ref 1.4–7.0)
Neutrophils: 68 %
Platelets: 200 10*3/uL (ref 150–450)
RBC: 5.55 x10E6/uL (ref 4.14–5.80)
RDW: 11.8 % (ref 11.6–15.4)
WBC: 7.5 10*3/uL (ref 3.4–10.8)

## 2023-01-31 LAB — LIPID PANEL
Chol/HDL Ratio: 4.7 ratio (ref 0.0–5.0)
Cholesterol, Total: 192 mg/dL (ref 100–199)
HDL: 41 mg/dL (ref >39)
LDL Chol Calc (NIH): 122 mg/dL — ABNORMAL HIGH (ref 0–99)
Triglycerides: 163 mg/dL — ABNORMAL HIGH (ref 0–149)
VLDL Cholesterol Cal: 29 mg/dL (ref 5–40)

## 2023-01-31 LAB — CMP14+EGFR
ALT: 18 IU/L (ref 0–44)
AST: 15 IU/L (ref 0–40)
Albumin/Globulin Ratio: 2
Albumin: 4.5 g/dL (ref 3.8–4.9)
Alkaline Phosphatase: 56 IU/L (ref 44–121)
BUN/Creatinine Ratio: 19 (ref 9–20)
BUN: 23 mg/dL (ref 6–24)
Bilirubin Total: 0.5 mg/dL (ref 0.0–1.2)
CO2: 23 mmol/L (ref 20–29)
Calcium: 10.4 mg/dL — ABNORMAL HIGH (ref 8.7–10.2)
Chloride: 104 mmol/L (ref 96–106)
Creatinine, Ser: 1.22 mg/dL (ref 0.76–1.27)
Globulin, Total: 2.2 g/dL (ref 1.5–4.5)
Glucose: 94 mg/dL (ref 70–99)
Potassium: 4.4 mmol/L (ref 3.5–5.2)
Sodium: 141 mmol/L (ref 134–144)
Total Protein: 6.7 g/dL (ref 6.0–8.5)
eGFR: 70 mL/min/{1.73_m2} (ref >59)

## 2023-01-31 LAB — PSA, TOTAL AND FREE
PSA, Free Pct: 36.7 %
PSA, Free: 0.22 ng/mL
Prostate Specific Ag, Serum: 0.6 ng/mL (ref 0.0–4.0)

## 2023-01-31 LAB — THYROID PANEL WITH TSH
Free Thyroxine Index: 1.9 (ref 1.2–4.9)
T3 Uptake Ratio: 25 % (ref 24–39)
T4, Total: 7.5 ug/dL (ref 4.5–12.0)
TSH: 1.4 u[IU]/mL (ref 0.450–4.500)

## 2023-02-24 LAB — COLOGUARD: COLOGUARD: NEGATIVE

## 2023-09-11 DIAGNOSIS — C44329 Squamous cell carcinoma of skin of other parts of face: Secondary | ICD-10-CM | POA: Diagnosis not present

## 2023-09-11 DIAGNOSIS — D2262 Melanocytic nevi of left upper limb, including shoulder: Secondary | ICD-10-CM | POA: Diagnosis not present

## 2023-09-11 DIAGNOSIS — D044 Carcinoma in situ of skin of scalp and neck: Secondary | ICD-10-CM | POA: Diagnosis not present

## 2023-09-11 DIAGNOSIS — L814 Other melanin hyperpigmentation: Secondary | ICD-10-CM | POA: Diagnosis not present

## 2023-09-11 DIAGNOSIS — Z85828 Personal history of other malignant neoplasm of skin: Secondary | ICD-10-CM | POA: Diagnosis not present

## 2023-09-11 DIAGNOSIS — L570 Actinic keratosis: Secondary | ICD-10-CM | POA: Diagnosis not present

## 2023-09-11 DIAGNOSIS — C4442 Squamous cell carcinoma of skin of scalp and neck: Secondary | ICD-10-CM | POA: Diagnosis not present

## 2023-09-11 DIAGNOSIS — D0462 Carcinoma in situ of skin of left upper limb, including shoulder: Secondary | ICD-10-CM | POA: Diagnosis not present

## 2023-09-11 DIAGNOSIS — D692 Other nonthrombocytopenic purpura: Secondary | ICD-10-CM | POA: Diagnosis not present

## 2023-10-29 ENCOUNTER — Other Ambulatory Visit: Payer: Self-pay | Admitting: Family Medicine

## 2023-10-29 DIAGNOSIS — E782 Mixed hyperlipidemia: Secondary | ICD-10-CM

## 2023-10-29 DIAGNOSIS — I10 Essential (primary) hypertension: Secondary | ICD-10-CM

## 2024-01-31 ENCOUNTER — Ambulatory Visit (INDEPENDENT_AMBULATORY_CARE_PROVIDER_SITE_OTHER): Payer: BC Managed Care – PPO | Admitting: Family Medicine

## 2024-01-31 ENCOUNTER — Encounter: Payer: Self-pay | Admitting: Family Medicine

## 2024-01-31 VITALS — BP 121/77 | HR 75 | Temp 97.3°F | Ht 70.0 in | Wt 279.4 lb

## 2024-01-31 DIAGNOSIS — Z0001 Encounter for general adult medical examination with abnormal findings: Secondary | ICD-10-CM

## 2024-01-31 DIAGNOSIS — Z0000 Encounter for general adult medical examination without abnormal findings: Secondary | ICD-10-CM

## 2024-01-31 DIAGNOSIS — Z1159 Encounter for screening for other viral diseases: Secondary | ICD-10-CM

## 2024-01-31 DIAGNOSIS — E782 Mixed hyperlipidemia: Secondary | ICD-10-CM

## 2024-01-31 DIAGNOSIS — J301 Allergic rhinitis due to pollen: Secondary | ICD-10-CM | POA: Insufficient documentation

## 2024-01-31 DIAGNOSIS — R351 Nocturia: Secondary | ICD-10-CM

## 2024-01-31 DIAGNOSIS — I10 Essential (primary) hypertension: Secondary | ICD-10-CM | POA: Diagnosis not present

## 2024-01-31 DIAGNOSIS — Z125 Encounter for screening for malignant neoplasm of prostate: Secondary | ICD-10-CM | POA: Diagnosis not present

## 2024-01-31 LAB — LIPID PANEL

## 2024-01-31 MED ORDER — LOSARTAN POTASSIUM-HCTZ 100-12.5 MG PO TABS: 1.0000 | ORAL_TABLET | Freq: Every day | ORAL | 3 refills | Status: AC

## 2024-01-31 MED ORDER — ROSUVASTATIN CALCIUM 10 MG PO TABS: 10.0000 mg | ORAL_TABLET | Freq: Every day | ORAL | 3 refills | Status: AC

## 2024-01-31 NOTE — Progress Notes (Signed)
 Complete physical exam  Patient: Christopher Fitzpatrick   DOB: 11-Jan-1966   58 y.o. Male  MRN: [id number]  Subjective:    Chief Complaint  Patient presents with   Annual Exam    Christopher Fitzpatrick is a 58 y.o. male who presents today for a complete physical exam. He reports consuming a general diet. Home exercise routine includes stationary bike every other day. He generally feels well. He reports sleeping well. He does not have additional problems to discuss today.   Christopher Fitzpatrick is a 58 year old male who presents for an annual physical exam.  He has no new health issues since his last annual physical exam. He continues to work and engages in regular exercise, riding his stationary bike every other morning, although he notes a decrease in speed from six miles per hour to four and a half miles per hour, attributing this change to aging and increased fatigue.  He is currently taking medication for blood pressure and cholesterol. He experiences muscle aches, which he associates with his cholesterol medication.  Regarding prostate health, he occasionally has to get up once at night to use the bathroom. No changes in bowel movements, such as dark or tarry stools.  His diet is not strictly regulated, but he tries to watch what he eats and wants his wife to reduce the frequency of bread and potatoes in his meals.  He reports worsening hearing, noting that he 'can't hear as good as he used to,' although his vision remains stable following a recent eye doctor appointment where he received new contacts.  He notes that his skin has become more fragile and easily cuts on various surfaces.  No chest pain, shortness of breath, leg swelling, or headaches.       Most recent fall risk assessment:    01/31/2024    8:14 AM  Fall Risk   Falls in the past year? 0  Number falls in past yr: 0  Injury with Fall? 0  Risk for fall due to : No Fall Risks  Follow up Falls evaluation completed     Most recent  depression screenings:    01/31/2024    8:14 AM 01/30/2023    8:24 AM  PHQ 2/9 Scores  PHQ - 2 Score 0 0  PHQ- 9 Score 1 0    Vision:Within last year and Dental: No current dental problems and Receives regular dental care  Patient Active Problem List   Diagnosis Date Noted   Tobacco use 10/25/2021   Mixed hyperlipidemia 06/27/2021   Controlled gout 06/27/2021   Total knee replacement status, right 02/13/2021   Essential hypertension 02/24/2020   Chronic midline low back pain 02/24/2020   Past Medical History:  Diagnosis Date   Arthritis    qwhere; knees, ankles, shoulders, hip (12/03/2017)   Essential hypertension    Gout    Hyperlipidemia    Left leg cellulitis 12/02/2017   Past Surgical History:  Procedure Laterality Date   CARPAL TUNNEL RELEASE Right 11/25/2015   Procedure: CARPAL TUNNEL RELEASE;  Surgeon: Yvonna Herder, MD;  Location: [facility];  Service: Neurosurgery;  Laterality: Right;   CARPAL TUNNEL RELEASE Left 12/23/2015   Procedure: CARPAL TUNNEL RELEASE;  Surgeon: Yvonna Herder, MD;  Location: [facility];  Service: Neurosurgery;  Laterality: Left;  Left Carpal Tunnel Release   INGUINAL HERNIA REPAIR Right 1986   KNEE ARTHROSCOPY Right 1987; 1988; 1989; 1990; ~ 1993   KNEE ARTHROSCOPY Left ~ 2014  KNEE RECONSTRUCTION Right 1985   REPLACEMENT TOTAL KNEE Right    Social History   Tobacco Use   Smoking status: Every Day    Types: Pipe   Smokeless tobacco: Former    Types: Snuff, Chew    Quit date: 1988  Vaping Use   Vaping status: Never Used  Substance Use Topics   Alcohol use: Yes    Comment: 12/03/2017 no more than 1 beer q couple months   Drug use: No   Social History   Socioeconomic History   Marital status: Married    Spouse name: Not on file   Number of children: Not on file   Years of education: Not on file   Highest education level: Not on file  Occupational History   Occupation: Advertising account executive, owns vending company  Tobacco  Use   Smoking status: Every Day    Types: Pipe   Smokeless tobacco: Former    Types: Snuff, Chew    Quit date: 1988  Vaping Use   Vaping status: Never Used  Substance and Sexual Activity   Alcohol use: Yes    Comment: 12/03/2017 no more than 1 beer q couple months   Drug use: No   Sexual activity: Yes    Birth control/protection: None  Other Topics Concern   Not on file  Social History Narrative   Not on file   Social Drivers of Health   Financial Resource Strain: Not on file  Food Insecurity: Not on file  Transportation Needs: Not on file  Physical Activity: Not on file  Stress: Not on file  Social Connections: Unknown (01/02/2022)   Received from [facility]   Social Network    Social Network: Not on file  Intimate Partner Violence: Unknown (11/23/2021)   Received from [facility]   HITS    Physically Hurt: Not on file    Insult or Talk Down To: Not on file    Threaten Physical Harm: Not on file    Scream or Curse: Not on file   Family Status  Relation Name Status   Mother  Deceased   Father  Deceased   Sister 2 Alive   Brother  Alive   Daughter 2 Alive   MGM  Deceased   MGF  Deceased   PGM  Deceased   PGF  Deceased  No partnership data on file   Family History  Problem Relation Age of Onset   Multiple myeloma Mother    Kidney disease Mother    Diabetes Mother    Hypertension Mother    Heart attack Father    Heart disease Father    Hypertension Father    Hyperlipidemia Father    Alcohol abuse Sister    Bipolar disorder Sister    Diabetes Sister    Hypertension Sister    Thyroid  disease Sister    Heart murmur Daughter    Lung cancer Paternal Grandmother    No Known Allergies    Patient Care Team: Justyne Roell, Georgeann Kindred, FNP as PCP - General (Family Medicine)   Outpatient Medications Prior to Visit  Medication Sig   [DISCONTINUED] losartan -hydrochlorothiazide  (HYZAAR) 100-12.5 MG tablet TAKE ONE (1) TABLET BY MOUTH EVERY DAY   [DISCONTINUED]  rosuvastatin  (CRESTOR ) 10 MG tablet TAKE ONE (1) TABLET BY MOUTH EVERY DAY   fluticasone  (FLONASE ) 50 MCG/ACT nasal spray Place 2 sprays into both nostrils daily.   No facility-administered medications prior to visit.    ROS per HPI, otherwise unremarkable  Objective:     BP 121/77   Pulse 75   Temp (!) 97.3 F (36.3 C)   Ht 5' 10 (1.778 m)   Wt 279 lb 6.4 oz (126.7 kg)   SpO2 96%   BMI 40.09 kg/m  BP Readings from Last 3 Encounters:  01/31/24 121/77  01/30/23 127/82  01/18/23 135/74   Wt Readings from Last 3 Encounters:  01/31/24 279 lb 6.4 oz (126.7 kg)  01/30/23 278 lb 3.2 oz (126.2 kg)  01/18/23 280 lb (127 kg)   SpO2 Readings from Last 3 Encounters:  01/31/24 96%  01/30/23 91%  01/18/23 95%      Physical Exam Vitals and nursing note reviewed.  Constitutional:      General: He is not in acute distress.    Appearance: Normal appearance. He is well-developed and well-groomed. He is morbidly obese. He is not ill-appearing, toxic-appearing or diaphoretic.  HENT:     Head: Normocephalic and atraumatic.     Jaw: There is normal jaw occlusion.     Right Ear: Hearing, tympanic membrane, ear canal and external ear normal.     Left Ear: Hearing, tympanic membrane, ear canal and external ear normal.     Nose: Nose normal.     Mouth/Throat:     Lips: Pink.     Mouth: Mucous membranes are moist.     Pharynx: Oropharynx is clear. Uvula midline.   Eyes:     General: Lids are normal.     Extraocular Movements: Extraocular movements intact.     Conjunctiva/sclera: Conjunctivae normal.     Pupils: Pupils are equal, round, and reactive to light.   Neck:     Thyroid : No thyroid  mass, thyromegaly or thyroid  tenderness.     Vascular: No carotid bruit or JVD.     Trachea: Trachea and phonation normal.   Cardiovascular:     Rate and Rhythm: Normal rate and regular rhythm.     Chest Wall: PMI is not displaced.     Pulses: Normal pulses.     Heart sounds: Normal  heart sounds. No murmur heard.    No friction rub. No gallop.  Pulmonary:     Effort: Pulmonary effort is normal. No respiratory distress.     Breath sounds: Normal breath sounds. No wheezing.  Abdominal:     General: Bowel sounds are normal. There is no distension or abdominal bruit.     Palpations: Abdomen is soft. There is no hepatomegaly or splenomegaly.     Tenderness: There is no abdominal tenderness. There is no right CVA tenderness or left CVA tenderness.     Hernia: No hernia is present.   Musculoskeletal:        General: Normal range of motion.     Cervical back: Normal range of motion and neck supple.     Right lower leg: No edema.     Left lower leg: No edema.  Lymphadenopathy:     Cervical: No cervical adenopathy.   Skin:    General: Skin is warm and dry.     Capillary Refill: Capillary refill takes less than 2 seconds.     Coloration: Skin is not cyanotic, jaundiced or pale.     Findings: No rash.   Neurological:     General: No focal deficit present.     Mental Status: He is alert and oriented to person, place, and time.     Sensory: Sensation is intact.     Motor: Motor function is intact.  Coordination: Coordination is intact.     Gait: Gait is intact.     Deep Tendon Reflexes: Reflexes are normal and symmetric.   Psychiatric:        Attention and Perception: Attention and perception normal.        Mood and Affect: Mood and affect normal.        Speech: Speech normal.        Behavior: Behavior normal. Behavior is cooperative.        Thought Content: Thought content normal.        Cognition and Memory: Cognition and memory normal.        Judgment: Judgment normal.      Last CBC Lab Results  Component Value Date   WBC 7.5 01/30/2023   HGB 16.3 01/30/2023   HCT 48.1 01/30/2023   MCV 87 01/30/2023   MCH 29.4 01/30/2023   RDW 11.8 01/30/2023   PLT 200 01/30/2023   Last metabolic panel Lab Results  Component Value Date   GLUCOSE 94 01/30/2023    NA 141 01/30/2023   K 4.4 01/30/2023   CL 104 01/30/2023   CO2 23 01/30/2023   BUN 23 01/30/2023   CREATININE 1.22 01/30/2023   EGFR 70 01/30/2023   CALCIUM  10.4 (H) 01/30/2023   PROT 6.7 01/30/2023   ALBUMIN 4.5 01/30/2023   LABGLOB 2.2 01/30/2023   AGRATIO 2.0 01/30/2023   BILITOT 0.5 01/30/2023   ALKPHOS 56 01/30/2023   AST 15 01/30/2023   ALT 18 01/30/2023   ANIONGAP 9 03/28/2018   Last lipids Lab Results  Component Value Date   CHOL 192 01/30/2023   HDL 41 01/30/2023   LDLCALC 122 (H) 01/30/2023   TRIG 163 (H) 01/30/2023   CHOLHDL 4.7 01/30/2023   Last hemoglobin A1c Lab Results  Component Value Date   HGBA1C 5.1 01/30/2023   Last thyroid  functions Lab Results  Component Value Date   TSH 1.400 01/30/2023   T4TOTAL 7.5 01/30/2023   Assessment & Plan:    Routine Health Maintenance and Physical Exam  Immunization History  Administered Date(s) Administered   Tdap 12/04/2017    Health Maintenance  Topic Date Due   Hepatitis C Screening  Never done   COVID-19 Vaccine (1 - 2024-25 season) 02/16/2024 (Originally 04/21/2023)   Zoster Vaccines- Shingrix (1 of 2) 05/02/2024 (Originally 07/08/2016)   Pneumococcal Vaccine 50-39 Years old (1 of 2 - PCV) 01/30/2025 (Originally 07/08/1985)   INFLUENZA VACCINE  03/20/2024   Fecal DNA (Cologuard)  02/17/2026   DTaP/Tdap/Td (2 - Td or Tdap) 12/05/2027   HIV Screening  Completed   HPV VACCINES  Aged Out   Meningococcal B Vaccine  Aged Out    Discussed health benefits of physical activity, and encouraged him to engage in regular exercise appropriate for his age and condition.  Problem List Items Addressed This Visit       Cardiovascular and Mediastinum   Essential hypertension   Relevant Medications   rosuvastatin  (CRESTOR ) 10 MG tablet   losartan -hydrochlorothiazide  (HYZAAR) 100-12.5 MG tablet   Other Relevant Orders   CBC with Differential/Platelet   CMP14+EGFR   Lipid panel   Thyroid  Panel With TSH      Other   Mixed hyperlipidemia   Relevant Medications   rosuvastatin  (CRESTOR ) 10 MG tablet   losartan -hydrochlorothiazide  (HYZAAR) 100-12.5 MG tablet   Other Relevant Orders   CMP14+EGFR   Lipid panel   Other Visit Diagnoses       Annual physical exam    -  Primary   Relevant Orders   CBC with Differential/Platelet   CMP14+EGFR   Hepatitis C antibody     Seasonal allergic rhinitis due to pollen         Screening for prostate cancer         Nocturia       Relevant Orders   PSA, total and free     Need for hepatitis C screening test       Relevant Orders   Hepatitis C antibody         Annual Physical Examination Routine annual physical examination with no new complaints or significant changes since last year. Reports increased fatigue, likely due to aging. No recent illnesses or surgeries. Slight hearing decline, stable vision with new contacts, and increased skin fragility, likely age-related. - Perform PSA test - Update lab work - Send medication refills for antihypertensive and lipid-lowering medications - Provide lotion sample to improve skin integrity - Advise consistent daily use of lotion  Hypertension Hypertension is well-controlled with current medication regimen. Initial blood pressure was slightly elevated but normalized upon recheck. No recent changes in symptoms or medication efficacy. - Continue current antihypertensive regimen - Monitor blood pressure at home  Hyperlipidemia On lipid-lowering medication and reports muscle aches, possibly medication-related. Monitoring for symptom progression. - Continue current lipid-lowering regimen - Monitor for muscle aches  General Health Maintenance Engages in regular exercise via stationary biking. Diet is mindful but not strictly controlled. No significant family history of prostate or colorectal cancer. Cologuard is current. - Advise continued regular exercise - Discuss dietary modifications to reduce bread and  potato intake - Plan for follow-up in one year if labs are normal       Return if symptoms worsen or fail to improve.     Kattie Parrot, FNP

## 2024-02-01 LAB — CMP14+EGFR
ALT: 17 IU/L (ref 0–44)
AST: 17 IU/L (ref 0–40)
Albumin: 4.7 g/dL (ref 3.8–4.9)
Alkaline Phosphatase: 50 IU/L (ref 44–121)
BUN/Creatinine Ratio: 20 (ref 9–20)
BUN: 25 mg/dL — ABNORMAL HIGH (ref 6–24)
Bilirubin Total: 0.7 mg/dL (ref 0.0–1.2)
CO2: 21 mmol/L (ref 20–29)
Calcium: 9.7 mg/dL (ref 8.7–10.2)
Chloride: 103 mmol/L (ref 96–106)
Creatinine, Ser: 1.24 mg/dL (ref 0.76–1.27)
Globulin, Total: 2.2 g/dL (ref 1.5–4.5)
Glucose: 102 mg/dL — ABNORMAL HIGH (ref 70–99)
Potassium: 4.2 mmol/L (ref 3.5–5.2)
Sodium: 140 mmol/L (ref 134–144)
Total Protein: 6.9 g/dL (ref 6.0–8.5)
eGFR: 68 mL/min/{1.73_m2} (ref >59)

## 2024-02-01 LAB — CBC WITH DIFFERENTIAL/PLATELET
Basophils Absolute: 0.1 10*3/uL (ref 0.0–0.2)
Basos: 1 %
EOS (ABSOLUTE): 0.1 10*3/uL (ref 0.0–0.4)
Eos: 2 %
Hematocrit: 51.3 % — ABNORMAL HIGH (ref 37.5–51.0)
Hemoglobin: 16.8 g/dL (ref 13.0–17.7)
Immature Grans (Abs): 0 10*3/uL (ref 0.0–0.1)
Immature Granulocytes: 0 %
Lymphocytes Absolute: 1.5 10*3/uL (ref 0.7–3.1)
Lymphs: 24 %
MCH: 30.1 pg (ref 26.6–33.0)
MCHC: 32.7 g/dL (ref 31.5–35.7)
MCV: 92 fL (ref 79–97)
Monocytes Absolute: 0.4 10*3/uL (ref 0.1–0.9)
Monocytes: 7 %
Neutrophils Absolute: 4.1 10*3/uL (ref 1.4–7.0)
Neutrophils: 66 %
Platelets: 191 10*3/uL (ref 150–450)
RBC: 5.58 x10E6/uL (ref 4.14–5.80)
RDW: 12.2 % (ref 11.6–15.4)
WBC: 6.2 10*3/uL (ref 3.4–10.8)

## 2024-02-01 LAB — HEPATITIS C ANTIBODY

## 2024-02-01 LAB — PSA, TOTAL AND FREE
PSA, Free Pct: 48 %
PSA, Free: 0.24 ng/mL
Prostate Specific Ag, Serum: 0.5 ng/mL (ref 0.0–4.0)

## 2024-02-01 LAB — LIPID PANEL
Cholesterol, Total: 170 mg/dL (ref 100–199)
HDL: 43 mg/dL (ref >39)
LDL CALC COMMENT:: 4 ratio (ref 0.0–5.0)
LDL Chol Calc (NIH): 112 mg/dL — ABNORMAL HIGH (ref 0–99)
Triglycerides: 79 mg/dL (ref 0–149)
VLDL Cholesterol Cal: 15 mg/dL (ref 5–40)

## 2024-02-01 LAB — THYROID PANEL WITH TSH
Free Thyroxine Index: 1.8 (ref 1.2–4.9)
T3 Uptake Ratio: 22 % — ABNORMAL LOW (ref 24–39)
T4, Total: 8.3 ug/dL (ref 4.5–12.0)
TSH: 1.2 u[IU]/mL (ref 0.450–4.500)

## 2024-02-03 ENCOUNTER — Ambulatory Visit: Payer: Self-pay | Admitting: Family Medicine

## 2024-03-17 ENCOUNTER — Telehealth: Payer: Self-pay

## 2024-03-17 ENCOUNTER — Ambulatory Visit: Payer: Self-pay

## 2024-03-17 DIAGNOSIS — L03115 Cellulitis of right lower limb: Secondary | ICD-10-CM | POA: Diagnosis not present

## 2024-03-17 NOTE — Telephone Encounter (Signed)
 Copied from CRM [id number]. Topic: General - Other >> Mar 17, 2024  2:29 PM Tobias L wrote: Reason for CRM: Patient returning call to [facility]. Advised of Ashley's message patient inquiring if he should be seen elsewhere.   Patient requesting call back, [phone]

## 2024-03-17 NOTE — Telephone Encounter (Signed)
 Patient aware and verbalizes understanding. And going to urgent care to get evaluated.

## 2024-03-17 NOTE — Telephone Encounter (Signed)
 Left message to return call. No appts available until Thursday with DOD.

## 2024-03-17 NOTE — Telephone Encounter (Signed)
 FYI Only or Action Required?: Action required by provider: no PCP availability, needs exam or call back.  Patient was last seen in primary care on 01/31/2024 by Severa Rock HERO, FNP.  Called Nurse Triage reporting Cellulitis, skin lesions, swelling to area of lesions, and warmth to area of lesions.  Symptoms began several days ago.  Interventions attempted: Rest, hydration, or home remedies.  Symptoms are: gradually worsening.  Triage Disposition: See Physician Within 24 Hours  Patient/caregiver understands and will follow disposition?: Yes    Copied from CRM #[id number]. Topic: Clinical - Red Word Triage >> Mar 17, 2024  1:24 PM Donna BRAVO wrote: Red Word that prompted transfer to Nurse Triage: patient called in, patient has spot on back of right knee, and some sores on the front, brown with crust on top, itching, swollen, hot to touch. Susceptible to cellulitis, they like to catch it early    Reason for Disposition  [1] Boil AND [2] diabetes mellitus or weak immune system (e.g., HIV positive, cancer chemo, splenectomy, organ transplant, chronic steroids)  Answer Assessment - Initial Assessment Questions 1. APPEARANCE of BOIL: What does the boil look like?      Brown with crust on top, redness around it, hot to touch, swelling Not whole lower leg just swelling in 5-6 inch radius from sore, sore on back of calf, swelling and itching in back and front, right below the knee at the knee joint 3. NUMBER: How many boils are there?      3 small ones but no crusts or white cracking, still just red like a cut 2 big ones concerned about 4. SIZE: How big is the boil? (e.g., inches, cm; compare to size of a coin or other object)     Size of a dime, size of pencil eraser 5. ONSET: When did the boil start?     Saturday 6. PAIN: Is there any pain? If Yes, ask: How bad is the pain?   (Scale 1-10; or mild, moderate, severe)     Just tight, itchy, uncomfortable, itching like crazy 7.  FEVER: Do you have a fever? If Yes, ask: What is it, how was it measured, and when did it start?      Not to my knowledge 9. OTHER SYMPTOMS: Do you have any other symptoms? (e.g., shaking chills, weakness, rash elsewhere on body)     No shaking chills or weakness Sores all over legs, both legs but right leg is one that's getting tight, red, irritation No hx blood clots in legs or lungs, SOB, chest pain No red streaking No black/purple/blistering on top of sores No severe pain Significant hx cellulitis, in hospital for it in past, like to get ahead of it    Advised pt be examined in next 24 hours, no PCP availability, sending message to office for call back to pt with further recommendations/appt options, advised go to UC if not heard back by end of day, advised call back if worsening or new symptoms like fever or streaking redness.  Protocols used: Boil (Skin Abscess)-A-AH

## 2024-03-17 NOTE — Telephone Encounter (Signed)
 Refer to other phone call encounter

## 2024-07-10 NOTE — Progress Notes (Signed)
 Christopher Fitzpatrick                                          MRN: [id number]   07/10/2024   The [facility] Quality Team Specialist reviewed this patient medical record for the purposes of chart review for care gap closure. The following were reviewed: abstraction for care gap closure-controlling blood pressure.    [facility] Quality Team

## 2025-02-02 ENCOUNTER — Encounter: Payer: Self-pay | Admitting: Family Medicine
